# Patient Record
Sex: Male | Born: 1965 | Race: White | Hispanic: No | Smoking: Current every day smoker
Health system: Southern US, Community
[De-identification: ages and names within clinical notes are randomized; demographics above are authoritative.]

## PROBLEM LIST (undated history)

## (undated) DIAGNOSIS — M1990 Unspecified osteoarthritis, unspecified site: Secondary | ICD-10-CM

---

## 2001-04-18 ENCOUNTER — Emergency Department (HOSPITAL_COMMUNITY): Admission: EM | Admit: 2001-04-18 | Discharge: 2001-04-18 | Payer: Self-pay | Admitting: Emergency Medicine

## 2001-04-18 ENCOUNTER — Encounter: Payer: Self-pay | Admitting: Emergency Medicine

## 2002-04-01 ENCOUNTER — Encounter: Payer: Self-pay | Admitting: Emergency Medicine

## 2002-04-01 ENCOUNTER — Emergency Department (HOSPITAL_COMMUNITY): Admission: EM | Admit: 2002-04-01 | Discharge: 2002-04-01 | Payer: Self-pay | Admitting: Emergency Medicine

## 2005-03-07 ENCOUNTER — Emergency Department (HOSPITAL_COMMUNITY): Admission: EM | Admit: 2005-03-07 | Discharge: 2005-03-07 | Payer: Self-pay | Admitting: Emergency Medicine

## 2005-07-23 ENCOUNTER — Emergency Department (HOSPITAL_COMMUNITY): Admission: EM | Admit: 2005-07-23 | Discharge: 2005-07-23 | Payer: Self-pay | Admitting: Emergency Medicine

## 2009-03-03 ENCOUNTER — Emergency Department (HOSPITAL_COMMUNITY): Admission: EM | Admit: 2009-03-03 | Discharge: 2009-03-03 | Payer: Self-pay | Admitting: Emergency Medicine

## 2009-04-07 ENCOUNTER — Emergency Department (HOSPITAL_COMMUNITY): Admission: EM | Admit: 2009-04-07 | Discharge: 2009-04-07 | Payer: Self-pay | Admitting: Emergency Medicine

## 2009-09-23 ENCOUNTER — Emergency Department (HOSPITAL_COMMUNITY): Admission: EM | Admit: 2009-09-23 | Discharge: 2009-09-23 | Payer: Self-pay | Admitting: Emergency Medicine

## 2009-11-22 ENCOUNTER — Emergency Department (HOSPITAL_COMMUNITY): Admission: EM | Admit: 2009-11-22 | Discharge: 2009-11-22 | Payer: Self-pay | Admitting: Emergency Medicine

## 2010-10-03 LAB — BASIC METABOLIC PANEL
BUN: 12 mg/dL (ref 6–23)
CO2: 24 mEq/L (ref 19–32)
GFR calc Af Amer: >60 mL/min (ref >60)
Sodium: 138 mEq/L (ref 135–145)

## 2010-10-03 LAB — DIFFERENTIAL
Lymphocytes Relative: 26 % (ref 12–46)
Monocytes Absolute: 0.6 10*3/uL (ref 0.1–1.0)
Monocytes Relative: 7 % (ref 3–12)
Neutro Abs: 5.3 10*3/uL (ref 1.7–7.7)

## 2010-10-03 LAB — SEDIMENTATION RATE: Sed Rate: 8 mm/hr (ref 0–16)

## 2010-10-03 LAB — URIC ACID: Uric Acid, Serum: 6.1 mg/dL (ref 4.0–7.8)

## 2010-10-03 LAB — CBC: MCV: 95.8 fL (ref 78.0–100.0)

## 2010-11-25 NOTE — Consult Note (Signed)
NAMENEWMAN, WAREN NO.:  [id number]   MEDICAL RECORD NO.:  [id number]          PATIENT TYPE:  EMS   LOCATION:  [facility]                         FACILITY:  [facility]   PHYSICIAN:  Artist Pais. Mina Marble, M.D.DATE OF BIRTH:  02/20/1966   DATE OF CONSULTATION:  07/23/2005  DATE OF DISCHARGE:  07/23/2005                                   CONSULTATION   REFERRING PHYSICIAN:  Nelva Nay, MD   REASON FOR CONSULTATION:  Mr. Kovacevic is a 45 year old ambidextrous male who  fell while playing football and presents today with pain and deformity of  the right upper extremity.  He is 45 years old.   ALLERGIES:  HE HAS NO KNOWN DRUG ALLERGIES.   MEDICATIONS:  No current medications.   PAST MEDICAL HISTORY:  No recent hospitalizations or surgery.   FAMILY HISTORY:  Noncontributory.   SOCIAL HISTORY:  He occasionally drinks and smokes.  He works as a Music therapist  for [employer].   PHYSICAL EXAMINATION:  GENERAL:  A well-developed, well-nourished male,  pleasant, alert and oriented x 3.  EXTREMITIES:  On examination of his right  upper extremity, he has pain and swelling dorsally in the area of the distal  radius.  He is neurovascularly intact.  He denies numbness and tingling in  the median radial and ulnar distribution.  Hand function appears to be  intact.   LABORATORY DATA:  X-rays show a comminuted distal radius fracture with some  shortening and dorsal angulation as well as a nondisplaced ulnar styloid  fracture.   ASSESSMENT/PLAN:  A 45 year old male with a distal radius and ulnar fracture  on his right side.  He was given a 2% plain lidocaine hematoma block and was  placed in fingertrap traction.  Reduction was performed.  Postreduction  films showed adequate reduction in both the AP and lateral planes.  He was  taken from the emergency room and  sent home with Percocet for pain.  Follow up in my office on July 27, 2005.  He was instructed on the signs and  symptoms of compartment syndrome  and told to return to the emergency room immediately or call my office if  any signs or symptoms appear in the right upper extremity.      Artist Pais Mina Marble, M.D.  Electronically Signed     MAW/MEDQ  D:  07/23/2005  T:  07/24/2005  Job:  [id number]   cc:   Nelva Nay, MD  Fax: [phone]

## 2012-11-22 ENCOUNTER — Emergency Department (HOSPITAL_COMMUNITY)
Admission: EM | Admit: 2012-11-22 | Discharge: 2012-11-22 | Disposition: A | Discharge status: Home / Self Care | Payer: Self-pay | Attending: Emergency Medicine | Admitting: Emergency Medicine

## 2012-11-22 ENCOUNTER — Encounter (HOSPITAL_COMMUNITY): Payer: Self-pay | Admitting: Emergency Medicine

## 2012-11-22 DIAGNOSIS — Y9289 Other specified places as the place of occurrence of the external cause: Secondary | ICD-10-CM | POA: Insufficient documentation

## 2012-11-22 DIAGNOSIS — Z8739 Personal history of other diseases of the musculoskeletal system and connective tissue: Secondary | ICD-10-CM | POA: Insufficient documentation

## 2012-11-22 DIAGNOSIS — Y990 Civilian activity done for income or pay: Secondary | ICD-10-CM | POA: Insufficient documentation

## 2012-11-22 DIAGNOSIS — IMO0002 Reserved for concepts with insufficient information to code with codable children: Secondary | ICD-10-CM | POA: Insufficient documentation

## 2012-11-22 DIAGNOSIS — F17200 Nicotine dependence, unspecified, uncomplicated: Secondary | ICD-10-CM | POA: Insufficient documentation

## 2012-11-22 DIAGNOSIS — Y9389 Activity, other specified: Secondary | ICD-10-CM | POA: Insufficient documentation

## 2012-11-22 DIAGNOSIS — T148XXA Other injury of unspecified body region, initial encounter: Secondary | ICD-10-CM

## 2012-11-22 DIAGNOSIS — X500XXA Overexertion from strenuous movement or load, initial encounter: Secondary | ICD-10-CM | POA: Insufficient documentation

## 2012-11-22 HISTORY — DX: Unspecified osteoarthritis, unspecified site: M19.90

## 2012-11-22 LAB — URINALYSIS, ROUTINE W REFLEX MICROSCOPIC
Bilirubin Urine: NEGATIVE
Glucose, UA: NEGATIVE mg/dL
Hgb urine dipstick: NEGATIVE
Leukocytes, UA: NEGATIVE
Nitrite: NEGATIVE
Protein, ur: NEGATIVE mg/dL
Specific Gravity, Urine: 1.031 — ABNORMAL HIGH (ref 1.005–1.030)
Urobilinogen, UA: 1 mg/dL (ref 0.0–1.0)
pH: 5.5 (ref 5.0–8.0)

## 2012-11-22 MED ORDER — MELOXICAM 7.5 MG PO TABS: 15.0000 mg | ORAL_TABLET | Freq: Every day | ORAL | Status: AC

## 2012-11-22 NOTE — ED Provider Notes (Signed)
History     CSN: [id number]  Arrival date & time 11/22/12  4098   First MD Initiated Contact with Patient 11/22/12 0602      Chief Complaint  Patient presents with  . Groin Pain    (Consider location/radiation/quality/duration/timing/severity/associated sxs/prior treatment) HPI Comments: Patient presents emergency department with chief complaint of right sided groin pain. Patient states that he's been having this pain for the past 1-2 weeks. 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 This is when his symptoms started. He denies fevers, chills, nausea, or vomiting. He denies any dysuria, hematuria or penile discharge. Denies any changes in bowel movements, no constipation or diarrhea. He denies pain in his testicles. He states that the pain is intermittent, but is worsened with hip flexion. The pain radiates to his right leg.  The history is provided by the patient. No language interpreter was used.    Past Medical History  Diagnosis Date  . Arthritis     History reviewed. No pertinent past surgical history.  History reviewed. No pertinent family history.  History  Substance Use Topics  . Smoking status: Current Every Day Smoker  . Smokeless tobacco: Not on file  . Alcohol Use: No      Review of Systems  All other systems reviewed and are negative.    Allergies  Review of patient's allergies indicates no known allergies.  Home Medications  No current outpatient prescriptions on file.  BP 137/80  Pulse 72  Temp(Src) 97.9 F (36.6 C) (Oral)  Resp 15  Ht 5\' 8"  (1.727 m)  Wt 180 lb (81.647 kg)  BMI 27.38 kg/m2  SpO2 96%  Physical Exam  Nursing note and vitals reviewed. Constitutional: He is oriented to person, place, and time. He appears well-developed and well-nourished.  HENT:  Head: Normocephalic and atraumatic.  Nose: Nose  normal.  Eyes: Conjunctivae and EOM are normal. Pupils are equal, round, and reactive to light. Right eye exhibits no discharge. Left eye exhibits no discharge. No scleral icterus.  Neck: Normal range of motion. Neck supple. No JVD present.  Cardiovascular: Normal rate, regular rhythm, normal heart sounds and intact distal pulses.  Exam reveals no gallop and no friction rub.   No murmur heard. Pulmonary/Chest: Effort normal and breath sounds normal. No respiratory distress. He has no wheezes. He has no rales. He exhibits no tenderness.  Abdominal: Soft. Bowel sounds are normal. He exhibits no distension and no mass. There is no tenderness. There is no rebound and no guarding.  No focal abdominal tenderness  Genitourinary: Penis normal.  Circumcised male, no penile discharge, no masses, or lesions on the penis, and no masses of the testicles, no palpable hernias in the scrotum or inguinal region  Musculoskeletal: Normal range of motion. He exhibits tenderness. He exhibits no edema.  Right sided quadriceps mild to moderately tender to palpation, mildly painful with hip flexion, range of motion 5/5, strength 5/5 in bilateral lower tremors  Neurological: He is alert and oriented to person, place, and time.  Skin: Skin is warm and dry.  Psychiatric: He has a normal mood and affect. His behavior is normal. Judgment and thought content normal.    ED Course  Procedures (including critical care time)  Labs Reviewed  URINALYSIS, ROUTINE W REFLEX MICROSCOPIC   Results for orders placed during the hospital encounter of 11/22/12  URINALYSIS, ROUTINE W  REFLEX MICROSCOPIC      Result Value Range   Color, Urine YELLOW  YELLOW   APPearance CLEAR  CLEAR   Specific Gravity, Urine 1.031 (*) 1.005 - 1.030   pH 5.5  5.0 - 8.0   Glucose, UA NEGATIVE  NEGATIVE mg/dL   Hgb urine dipstick NEGATIVE  NEGATIVE   Bilirubin Urine NEGATIVE  NEGATIVE   Ketones, ur NEGATIVE  NEGATIVE mg/dL   Protein, ur NEGATIVE   NEGATIVE mg/dL   Urobilinogen, UA 1.0  0.0 - 1.0 mg/dL   Nitrite NEGATIVE  NEGATIVE   Leukocytes, UA NEGATIVE  NEGATIVE      1. Muscle strain       MDM  Patient with right groin pain.  No testicle pain.  No masses or herniations.  No dysuria, hematuria, or discharge.  No fevers, chills, or n/v.  Suspect muscle strain, as the patient's symptoms are reproducible with hip flexion and pain on palpation of quadriceps.  Discussed with Dr. Rulon Abide, who agrees with the plan.  Ice and NSAIDs.  Patient requests Mobic.  Patient is stable and ready for discharge.        Roxy Horseman, PA-C 11/22/12 [phone]

## 2012-11-22 NOTE — ED Provider Notes (Signed)
Medical screening examination/treatment/procedure(s) were performed by non-physician practitioner and as supervising physician I was immediately available for consultation/collaboration.  Jones Skene, M.D.     Jones Skene, MD 11/22/12 [phone]

## 2012-11-22 NOTE — ED Notes (Addendum)
Pt states his job requires him to do a lot of lifting. One day lifting he felt a pulled area R side of groin. Pt states no changes in urine color or output. Pt states no redness or swelling in that area.

## 2012-12-03 ENCOUNTER — Emergency Department (HOSPITAL_COMMUNITY): Payer: Self-pay

## 2012-12-03 ENCOUNTER — Encounter (HOSPITAL_COMMUNITY): Payer: Self-pay | Admitting: Cardiology

## 2012-12-03 ENCOUNTER — Emergency Department (HOSPITAL_COMMUNITY)
Admission: EM | Admit: 2012-12-03 | Discharge: 2012-12-03 | Disposition: A | Discharge status: Home / Self Care | Payer: Self-pay | Attending: Emergency Medicine | Admitting: Emergency Medicine

## 2012-12-03 DIAGNOSIS — F17200 Nicotine dependence, unspecified, uncomplicated: Secondary | ICD-10-CM | POA: Insufficient documentation

## 2012-12-03 DIAGNOSIS — S76911D Strain of unspecified muscles, fascia and tendons at thigh level, right thigh, subsequent encounter: Secondary | ICD-10-CM

## 2012-12-03 DIAGNOSIS — Y9389 Activity, other specified: Secondary | ICD-10-CM | POA: Insufficient documentation

## 2012-12-03 DIAGNOSIS — Y929 Unspecified place or not applicable: Secondary | ICD-10-CM | POA: Insufficient documentation

## 2012-12-03 DIAGNOSIS — Z79899 Other long term (current) drug therapy: Secondary | ICD-10-CM | POA: Insufficient documentation

## 2012-12-03 DIAGNOSIS — M129 Arthropathy, unspecified: Secondary | ICD-10-CM | POA: Insufficient documentation

## 2012-12-03 DIAGNOSIS — X58XXXA Exposure to other specified factors, initial encounter: Secondary | ICD-10-CM | POA: Insufficient documentation

## 2012-12-03 DIAGNOSIS — IMO0002 Reserved for concepts with insufficient information to code with codable children: Secondary | ICD-10-CM | POA: Insufficient documentation

## 2012-12-03 MED ORDER — METHOCARBAMOL 500 MG PO TABS
1000.0000 mg | ORAL_TABLET | Freq: Once | ORAL | Status: AC
  Administered 2012-12-03: 1000 mg via ORAL
  Filled 2012-12-03: qty 2

## 2012-12-03 MED ORDER — METHOCARBAMOL 750 MG PO TABS: 750.0000 mg | ORAL_TABLET | Freq: Four times a day (QID) | ORAL | Status: AC | PRN

## 2012-12-03 NOTE — ED Provider Notes (Signed)
History     CSN: [id number]  Arrival date & time 12/03/12  8295   First MD Initiated Contact with Patient 12/03/12 [phone]      Chief Complaint  Patient presents with  . Groin Pain    (Consider location/radiation/quality/duration/timing/severity/associated sxs/prior treatment) HPI Comments: Pt was seen in ED 5/16 for right groin pain, diagnosed with muscle strain.  Pain is in right inner thigh with radiation into right anterior thigh, described as sore, worse with ambulation, better with rest.  Has been taking mobic without relief.  States he took the weekend off of work after this happened and seemed to be improving, but the first day he started back to work the pain increased again and he was having pain with walking.  Pain occasionally in right testicle and occasional numbness in right calf that occurs only when it is elevated and resolves spontaneously with change of position.  Denies fevers, chills, myalgias, abdominal pain, scrotal/testicular swelling, penile discharge, change in bowel habits, N/V/D, constipation, hematochezia or melena.    Patient is a 47 y.o. male presenting with groin pain. The history is provided by the patient and medical records.  Groin Pain Pertinent negatives include no abdominal pain, chest pain, chills, coughing, fever, nausea, numbness, rash, vomiting or weakness.    Past Medical History  Diagnosis Date  . Arthritis     History reviewed. No pertinent past surgical history.  History reviewed. No pertinent family history.  History  Substance Use Topics  . Smoking status: Current Every Day Smoker    Types: Cigarettes  . Smokeless tobacco: Not on file  . Alcohol Use: No      Review of Systems  Constitutional: Negative for fever and chills.  Respiratory: Negative for cough and shortness of breath.   Cardiovascular: Negative for chest pain.  Gastrointestinal: Negative for nausea, vomiting, abdominal pain and diarrhea.  Genitourinary: Negative for  dysuria, urgency, frequency, penile swelling, scrotal swelling and penile pain.  Skin: Negative for rash.  Neurological: Negative for weakness and numbness.    Allergies  Review of patient's allergies indicates no known allergies.  Home Medications   Current Outpatient Rx  Name  Route  Sig  Dispense  Refill  . meloxicam (MOBIC) 7.5 MG tablet   Oral   Take 2 tablets (15 mg total) by mouth daily.   30 tablet   0     BP 119/76  Pulse 64  Temp(Src) 97.3 F (36.3 C) (Oral)  Resp 18  SpO2 98%  Physical Exam  Nursing note and vitals reviewed. Constitutional: He appears well-developed and well-nourished. No distress.  HENT:  Head: Normocephalic and atraumatic.  Neck: Neck supple.  Pulmonary/Chest: Effort normal.  Abdominal: Soft. He exhibits no distension and no mass. There is no tenderness. There is no rebound and no guarding. Hernia confirmed negative in the right inguinal area.  Genitourinary: Testes normal and penis normal.    Right testis shows no mass, no swelling and no tenderness. Left testis shows no mass, no swelling and no tenderness. Circumcised.  Musculoskeletal:       Right hip: Normal.  Right lower extremity:  Strength 5/5, sensation intact, distal pulses intact.    Right thigh: mild tenderness to proximal, medial aspect.  No skin changes.  No erythema, edema, warmth.  Right hip with full active and passive ROM.  Pain is flexion of hip and abduction.  No bony tenderness.     Lymphadenopathy:       Right: No inguinal adenopathy present.  Neurological: He is alert.  Skin: He is not diaphoretic.    ED Course  Procedures (including critical care time)  Labs Reviewed - No data to display Dg Hip Complete Right  12/03/2012   *RADIOLOGY REPORT*  Clinical Data: Injury 3 weeks previous with persistent pain  RIGHT HIP - COMPLETE 2+ VIEW  Comparison: None.  Findings: No acute fracture or dislocation is noted.  No soft tissue abnormality is seen.  The pelvic ring is  intact.  IMPRESSION: No acute abnormality noted.   Original Report Authenticated By: Alcide Clever, M.D.     1. Muscle strain of thigh, right, subsequent encounter     MDM  Pt with right thigh pain, seen in ED on 5/16, diagnosed with muscle strain.  No hernias on exam.  No abdominal pain or tenderness.  Given worsening/persistent pain, xray ordered of hip to r/o hip and pelvic bony abnormalities causing symptoms.  Xray is negative.  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  Discussed all results with patient.  Pt given return precautions.  Pt verbalizes understanding and agrees with plan.     I doubt any other EMC precluding discharge at this time including, but not necessarily limited to the following:  Incarcerated/strangulted hernia, septic hip, epididymitis, testicular torsion     Trixie Dredge, PA-C 12/03/12 1052

## 2012-12-03 NOTE — ED Provider Notes (Signed)
Medical screening examination/treatment/procedure(s) were performed by non-physician practitioner and as supervising physician I was immediately available for consultation/collaboration.  Harshita Bernales, MD 12/03/12 1604 

## 2012-12-03 NOTE — ED Notes (Signed)
Pt returned from x-ray. Up to the bathroom. Provider now at the bedside.

## 2012-12-03 NOTE — ED Notes (Signed)
Provider at the bedside.  

## 2012-12-03 NOTE — ED Notes (Signed)
Pt reports that he went to [facility] about a week ago and given medication for pain in his groin area and swelling. Reports that he feels worse and the pain is not getting better. Denies any urinary problems. Pt points to inner thigh for the area of pain.

## 2012-12-14 ENCOUNTER — Emergency Department (HOSPITAL_COMMUNITY)
Admission: EM | Admit: 2012-12-14 | Discharge: 2012-12-14 | Disposition: A | Discharge status: Home / Self Care | Payer: Self-pay | Attending: Emergency Medicine | Admitting: Emergency Medicine

## 2012-12-14 ENCOUNTER — Encounter (HOSPITAL_COMMUNITY): Payer: Self-pay | Admitting: Emergency Medicine

## 2012-12-14 DIAGNOSIS — IMO0001 Reserved for inherently not codable concepts without codable children: Secondary | ICD-10-CM | POA: Insufficient documentation

## 2012-12-14 DIAGNOSIS — M79651 Pain in right thigh: Secondary | ICD-10-CM

## 2012-12-14 DIAGNOSIS — F17200 Nicotine dependence, unspecified, uncomplicated: Secondary | ICD-10-CM | POA: Insufficient documentation

## 2012-12-14 DIAGNOSIS — M25559 Pain in unspecified hip: Secondary | ICD-10-CM | POA: Insufficient documentation

## 2012-12-14 DIAGNOSIS — Z79899 Other long term (current) drug therapy: Secondary | ICD-10-CM | POA: Insufficient documentation

## 2012-12-14 DIAGNOSIS — Z8739 Personal history of other diseases of the musculoskeletal system and connective tissue: Secondary | ICD-10-CM | POA: Insufficient documentation

## 2012-12-14 MED ORDER — HYDROCODONE-ACETAMINOPHEN 5-325 MG PO TABS
1.0000 | ORAL_TABLET | Freq: Once | ORAL | Status: AC
  Administered 2012-12-14: 1 via ORAL
  Filled 2012-12-14: qty 1

## 2012-12-14 MED ORDER — HYDROCODONE-ACETAMINOPHEN 5-325 MG PO TABS: 2.0000 | ORAL_TABLET | ORAL | Status: DC | PRN

## 2012-12-14 NOTE — ED Notes (Signed)
Pt presents to ED with complaints of right leg pain for about a month. Pt states this is his third visit for same without relief.

## 2012-12-14 NOTE — ED Provider Notes (Signed)
History/physical exam/procedure(s) were performed by non-physician practitioner and as supervising physician I was immediately available for consultation/collaboration. I have reviewed all notes and am in agreement with care and plan.   Hilario Quarry, MD 12/14/12 [phone]

## 2012-12-14 NOTE — ED Notes (Signed)
Pt. Demonstrated proper use of using crutches.

## 2012-12-14 NOTE — ED Provider Notes (Signed)
History     CSN: [id number]  Arrival date & time 12/14/12  4098   First MD Initiated Contact with Patient 12/14/12 0848      Chief Complaint  Patient presents with  . Leg Pain    (Consider location/radiation/quality/duration/timing/severity/associated sxs/prior treatment) HPI  47 year old male with history of arthritis presents complaining of right leg pain ongoing for one month. 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  For the past month he has experienced worsening pain to his R thigh which radiates down to R leg.  Pain is achy, sharp sensation worsening with weight bearing and improves with rest.  Pt first noticed the pain when he was lifting some heavy object at work, but pain was initially mild.  Pt was seen in ER several weeks ago, diagnosed with muscle strain and have been performing RICE therapy.  Pain not iproved, was seen in ER 2 weeks ago for same complaint.  Xrays were performed showing no fx or dislocation.  He was given muscle relaxant and NSAIDs with helps minimally.  He has continue to work but is having difficulty bearing weight and having increasing pain at work.  He's here again for pain control.  Denies fever, rash, cp, sob, hemoptysis, testicular pain or scrotal swelling, no hernia, no dysuria, no numbness or weakness.  Pain primarily to R medial thigh radiates to R buttock and down to anterior thigh.  Denies leg swelling.  Is a smoker but no other significant risk factors for PE or DVT.    Past Medical History  Diagnosis Date  . Arthritis     History reviewed. No pertinent past surgical history.  History reviewed. No pertinent family history.  History  Substance Use Topics  . Smoking status: Current Every Day Smoker    Types: Cigarettes  . Smokeless tobacco: Not on file  . Alcohol Use: No      Review of Systems  Constitutional: Negative for fever.  Gastrointestinal: Negative for abdominal pain.  Genitourinary:  Negative for hematuria, flank pain, scrotal swelling and testicular pain.  Musculoskeletal: Positive for myalgias, back pain, arthralgias and gait problem.  Skin: Negative for rash and wound.  Neurological: Negative for numbness.    Allergies  Review of patient's allergies indicates no known allergies.  Home Medications   Current Outpatient Rx  Name  Route  Sig  Dispense  Refill  . ibuprofen (ADVIL,MOTRIN) 200 MG tablet   Oral   Take 400 mg by mouth every 6 (six) hours as needed for pain.         . meloxicam (MOBIC) 7.5 MG tablet   Oral   Take 2 tablets (15 mg total) by mouth daily.   30 tablet   0   . methocarbamol (ROBAXIN) 750 MG tablet   Oral   Take 1 tablet (750 mg total) by mouth 4 (four) times daily as needed.   20 tablet   0   . Multiple Vitamin (MULTIVITAMIN WITH MINERALS) TABS   Oral   Take 1 tablet by mouth daily.           BP 138/83  Pulse 64  Temp(Src) 97.6 F (36.4 C) (Oral)  Resp 14  Ht 5\' 8"  (1.727 m)  Wt 175 lb (79.379 kg)  BMI 26.61 kg/m2  SpO2 99%  Physical Exam  Nursing note and vitals reviewed. Constitutional: He appears well-developed and well-nourished. No distress.  HENT:  Head: Atraumatic.  Eyes: Conjunctivae are normal.  Neck: Neck supple.  Cardiovascular: Intact distal pulses.   Musculoskeletal: He exhibits tenderness (R leg: tenderness along medial, anterior, and lateral aspect of R thigh on palpation.  increasing pain with R hip abduction and adduction however with FROM to R hip.  BLE without palpable cords, erythema, edema, negative homan sign.  ). He exhibits no edema.  Neurological: He is alert.  Skin: Skin is warm. No rash noted.    ED Course  Procedures (including critical care time)  9:30 AM Patient with progressive right thigh pain. Pain appears to be musculoskeletal as it is worsened with movement and improves with rest. No evidence of hernia noted. No evidence of cellulitis or abscess noted. No signs and symptoms  suggestive of DVT. He is neurovascularly intact. The symptom has been ongoing for a month. He will benefit from rice therapy, and followup with orthopedic for further management. I will give patient a work note due to his demanding physical labor. Will also prescribe pain medication as over-the-counter medication is no longer effective. He is afebrile with stable normal vital sign.  Doubt claudication.  Labs Reviewed - No data to display No results found.   1. Right thigh pain       MDM  BP 138/83  Pulse 64  Temp(Src) 97.6 F (36.4 C) (Oral)  Resp 14  Ht 5\' 8"  (1.727 m)  Wt 175 lb (79.379 kg)  BMI 26.61 kg/m2  SpO2 99%  I have reviewed nursing notes and vital signs.   I reviewed available ER/hospitalization records thought the EMR         Fayrene Helper, [place] 12/14/12 1610

## 2012-12-30 ENCOUNTER — Encounter (HOSPITAL_COMMUNITY): Payer: Self-pay | Admitting: Emergency Medicine

## 2012-12-30 ENCOUNTER — Emergency Department (HOSPITAL_COMMUNITY)
Admission: EM | Admit: 2012-12-30 | Discharge: 2012-12-30 | Disposition: A | Discharge status: Home / Self Care | Payer: Self-pay | Attending: Emergency Medicine | Admitting: Emergency Medicine

## 2012-12-30 ENCOUNTER — Emergency Department (HOSPITAL_COMMUNITY): Payer: Self-pay

## 2012-12-30 DIAGNOSIS — M79609 Pain in unspecified limb: Secondary | ICD-10-CM | POA: Insufficient documentation

## 2012-12-30 DIAGNOSIS — M129 Arthropathy, unspecified: Secondary | ICD-10-CM | POA: Insufficient documentation

## 2012-12-30 DIAGNOSIS — Z791 Long term (current) use of non-steroidal anti-inflammatories (NSAID): Secondary | ICD-10-CM | POA: Insufficient documentation

## 2012-12-30 DIAGNOSIS — M79604 Pain in right leg: Secondary | ICD-10-CM

## 2012-12-30 DIAGNOSIS — F17200 Nicotine dependence, unspecified, uncomplicated: Secondary | ICD-10-CM | POA: Insufficient documentation

## 2012-12-30 DIAGNOSIS — Z79899 Other long term (current) drug therapy: Secondary | ICD-10-CM | POA: Insufficient documentation

## 2012-12-30 MED ORDER — HYDROMORPHONE HCL PF 1 MG/ML IJ SOLN: 1.0000 mg | Freq: Once | INTRAMUSCULAR | Status: DC

## 2012-12-30 MED ORDER — OXYCODONE-ACETAMINOPHEN 5-325 MG PO TABS: 1.0000 | ORAL_TABLET | ORAL | Status: DC | PRN

## 2012-12-30 MED ORDER — KETOROLAC TROMETHAMINE 60 MG/2ML IM SOLN
60.0000 mg | Freq: Once | INTRAMUSCULAR | Status: AC
  Administered 2012-12-30: 60 mg via INTRAMUSCULAR
  Filled 2012-12-30: qty 2

## 2012-12-30 NOTE — ED Notes (Signed)
Rt leg pain for a while 6 weeks  has been seen and tx given cuitches seems to be getting worse. No new injury has run out of pain meds pain extends from calf to knee and down  Leg hurt origianlly by lifting something heavy has no insurance so cannot see ortho dr

## 2012-12-30 NOTE — ED Notes (Signed)
Pt states that he is driving himself dr Hyacinth Meeker made aware and he states to just give pt toredol

## 2012-12-30 NOTE — ED Provider Notes (Signed)
History     CSN: [id number]  Arrival date & time 12/30/12  0744   First MD Initiated Contact with Patient 12/30/12 [phone]      Chief Complaint  Patient presents with  . Leg Pain    (Consider location/radiation/quality/duration/timing/severity/associated sxs/prior treatment) HPI Comments: 47 year old male who has a history of a small amount of arthritis, smokes cigarettes but has no other significant medical problems. 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 He felt pain in his right groin radiating into his right thigh which has been persistent since that time. When he is laying down and nonweightbearing the pain is minimal however when he walks or tries to use the right leg he has severe pain. He feels like the pain is sharp, radiates to his right knee and feels like his right knee gives out. He has been ambulating with crutches, denies swelling of the leg. He has been evaluated with a hip x-ray in the emergency department one month ago, no other evaluations, no specialty follow up. He has been using Aleve with minimal improvement, states hydrocodone does not help.  Patient is a 47 y.o. male presenting with leg pain. The history is provided by the patient and medical records.  Leg Pain   Past Medical History  Diagnosis Date  . Arthritis     No past surgical history on file.  No family history on file.  History  Substance Use Topics  . Smoking status: Current Every Day Smoker    Types: Cigarettes  . Smokeless tobacco: Not on file  . Alcohol Use: No      Review of Systems  All other systems reviewed and are negative.    Allergies  Review of patient's allergies indicates no known allergies.  Home Medications   Current Outpatient Rx  Name  Route  Sig  Dispense  Refill  . HYDROcodone-acetaminophen (NORCO/VICODIN) 5-325 MG per tablet   Oral   Take 2 tablets by mouth every 4  (four) hours as needed for pain.   10 tablet   0   . ibuprofen (ADVIL,MOTRIN) 200 MG tablet   Oral   Take 400 mg by mouth every 6 (six) hours as needed for pain.         . meloxicam (MOBIC) 7.5 MG tablet   Oral   Take 2 tablets (15 mg total) by mouth daily.   30 tablet   0   . methocarbamol (ROBAXIN) 750 MG tablet   Oral   Take 1 tablet (750 mg total) by mouth 4 (four) times daily as needed.   20 tablet   0   . Multiple Vitamin (MULTIVITAMIN WITH MINERALS) TABS   Oral   Take 1 tablet by mouth daily.         . Naproxen Sodium (ALEVE PO)   Oral   Take 440 mg by mouth 2 (two) times daily.         Marland Kitchen oxyCODONE-acetaminophen (PERCOCET) 5-325 MG per tablet   Oral   Take 1 tablet by mouth every 4 (four) hours as needed for pain.   20 tablet   0     BP 129/89  Pulse 76  Temp(Src) 98.2 F (36.8 C)  Resp 16  SpO2 99%  Physical Exam  Nursing note and vitals reviewed. Constitutional: He appears well-developed and well-nourished. No distress.  HENT:  Head: Normocephalic and atraumatic.  Mouth/Throat: Oropharynx  is clear and moist. No oropharyngeal exudate.  Eyes: Conjunctivae and EOM are normal. Pupils are equal, round, and reactive to light. Right eye exhibits no discharge. Left eye exhibits no discharge. No scleral icterus.  Neck: Normal range of motion. Neck supple. No JVD present. No thyromegaly present.  Cardiovascular: Normal rate, regular rhythm, normal heart sounds and intact distal pulses.  Exam reveals no gallop and no friction rub.   No murmur heard. Pulmonary/Chest: Effort normal and breath sounds normal. No respiratory distress. He has no wheezes. He has no rales.  Abdominal: Soft. Bowel sounds are normal. He exhibits no distension and no mass. There is no tenderness.  Musculoskeletal: Normal range of motion. He exhibits tenderness. He exhibits no edema.  Significant tenderness with ambulation, right knee gives out with ambulation, no obvious deformity of  the lower extremity, able to extend at the knee when the hip is passively flexed, no quadricep defect or patellar tendon defect, no tenderness with palpation over the quadriceps or the knee. There is no instability around the right knee joint.  Lymphadenopathy:    He has no cervical adenopathy.  Neurological: He is alert. Coordination normal.  Normal sensation to light touch and pinprick of the right lower extremity, normal strength at the knee and the ankle, given out weakness at the right hip secondary to pain with hip flexors.  Skin: Skin is warm and dry. No rash noted. No erythema.  Psychiatric: He has a normal mood and affect. His behavior is normal.    ED Course  Procedures (including critical care time)  Labs Reviewed - No data to display Dg Femur Right  12/30/2012   *RADIOLOGY REPORT*  Clinical Data: groin injury 6 weeks ago.  Continued pain.  RIGHT FEMUR - 2 VIEW  Comparison: 12/03/2012  Findings: Mild patellar spurring.   No fracture or acute bony findings.  IMPRESSION:  1.  No acute bony findings. 2.  Patellar spurring.   Original Report Authenticated By: Gaylyn Rong, M.D.   Dg Knee Complete 4 Views Right  12/30/2012   *RADIOLOGY REPORT*  Clinical Data: Injury 6 weeks ago, pain  RIGHT KNEE - COMPLETE 4+ VIEW  Comparison: 12/30/2012  Findings: Normal alignment without fracture or effusion.  Preserved joint spaces.  No significant arthropathy or degenerative process. No soft tissue abnormality.  IMPRESSION: No acute finding   Original Report Authenticated By: Judie Petit. Shick, M.D.     1. Lower extremity pain, anterior, right       MDM  No obvious etiology to the patient's pain is evident on exam however he does have significant pain with straight leg raising when the knee is not raised, this leads me to think that the patient has hip flexor pain and inflammation. There is not appear to be any bony deformities however will image to evaluate for underlying bony tumor. Pain  medications ordered, orthopedic subspecialty followup will be necessary.  This does not appear to be a neurological abnormality, it started with what was apparently a Musculoskeletal injury and has persisted essentially unchanged over the last several weeks.  He will need ongoing PT, orthopedic f/u and nsaids / pain control to appropriately rehabilitate.  Imaging neg for frx / mass.  Meds given in ED:  Medications  ketorolac (TORADOL) injection 60 mg (60 mg Intramuscular Given 12/30/12 0832)    Discharge Medication List as of 12/30/2012 11:19 AM    START taking these medications   Details  oxyCODONE-acetaminophen (PERCOCET) 5-325 MG per tablet Take 1 tablet by  mouth every 4 (four) hours as needed for pain., Starting 12/30/2012, Until Discontinued, Print                Vida Roller, MD 12/30/12 2043

## 2014-12-14 ENCOUNTER — Emergency Department (HOSPITAL_COMMUNITY)
Admission: EM | Admit: 2014-12-14 | Discharge: 2014-12-14 | Disposition: A | Discharge status: Home / Self Care | Payer: Self-pay | Attending: Emergency Medicine | Admitting: Emergency Medicine

## 2014-12-14 ENCOUNTER — Encounter (HOSPITAL_COMMUNITY): Payer: Self-pay | Admitting: Emergency Medicine

## 2014-12-14 DIAGNOSIS — M1990 Unspecified osteoarthritis, unspecified site: Secondary | ICD-10-CM | POA: Insufficient documentation

## 2014-12-14 DIAGNOSIS — Z791 Long term (current) use of non-steroidal anti-inflammatories (NSAID): Secondary | ICD-10-CM | POA: Insufficient documentation

## 2014-12-14 DIAGNOSIS — Z79899 Other long term (current) drug therapy: Secondary | ICD-10-CM | POA: Insufficient documentation

## 2014-12-14 DIAGNOSIS — H6011 Cellulitis of right external ear: Secondary | ICD-10-CM | POA: Insufficient documentation

## 2014-12-14 DIAGNOSIS — Z720 Tobacco use: Secondary | ICD-10-CM | POA: Insufficient documentation

## 2014-12-14 LAB — CBG MONITORING, ED: GLUCOSE-CAPILLARY: 119 mg/dL — AB (ref 65–99)

## 2014-12-14 MED ORDER — CEPHALEXIN 500 MG PO CAPS: 500.0000 mg | ORAL_CAPSULE | Freq: Four times a day (QID) | ORAL | Status: DC

## 2014-12-14 NOTE — ED Provider Notes (Signed)
9:07 AM Patient seen with Elease Hashimotoapia PA-C. Patient chronically picks at his skin. He has opened up a small area on the R pinna/helix. No significant cellulitis however patient has developed a reactive LN inferior to ear. He appears well, non-toxic.   Plan: d/c home with keflex, discussed wound care  Pt urged to return with worsening pain, worsening swelling, expanding area of redness or streaking up extremity, fever, or any other concerns. Urged to take complete course of antibiotics as prescribed. Pt verbalizes understanding and agrees with plan.   Renne CriglerJoshua Maynard David, PA-C 12/14/14 [id number]  Pricilla LovelessScott Goldston, MD 12/16/14 1011

## 2014-12-14 NOTE — ED Provider Notes (Signed)
CSN: [id number]     Arrival date & time 12/14/14  0740 History   First MD Initiated Contact with Patient 12/14/14 [phone]     Chief Complaint  Patient presents with  . Otalgia     (Consider location/radiation/quality/duration/timing/severity/associated sxs/prior Treatment) HPI   Jesse Harding is a pleasant 49 y.o. Man, otherwise healthy, who [facility] for a 1 month history of a sore on his right ear that has been more swollen over the past two days.  He states he has a history of picking at his skin, and is covered in scars all over his arms from it, and he frequently picks at "ingrown hairs" on his ears.  This one has been there for a month, has not changed over that time, except for over the last three days his ear was more swollen and there has been clear drainage.  He has a lump directly below it which has also increased in size, although this morning when he woke up there was a larger amount of dried fluid in and on his ear and also on his pillow, and the swelling had decreased.  The lump is mildly tender.  He denies any redness associated with the swelling.  He has not had any fever, chills, or sweats.  He has no difficulty chewing, moving jaw or swallowing. He does not have insurance and is concerned about cost.  He has had several other episodes of lesions on his ears or neck that he has repeatedly picked at, with subsequent swollen "bumps" that eventually resolve on their own.  He is concerned because this one has been there the longest, and has caused the most swelling, although it is better today than it was last night.  Past Medical History  Diagnosis Date  . Arthritis    History reviewed. No pertinent past surgical history. History reviewed. No pertinent family history. History  Substance Use Topics  . Smoking status: Current Every Day Smoker    Types: Cigarettes  . Smokeless tobacco: Not on file  . Alcohol Use: No    Review of Systems  All other systems  reviewed and are negative.     Allergies  Review of patient's allergies indicates no known allergies.  Home Medications   Prior to Admission medications   Medication Sig Start Date End Date Taking? Authorizing Provider  HYDROcodone-acetaminophen (NORCO/VICODIN) 5-325 MG per tablet Take 2 tablets by mouth every 4 (four) hours as needed for pain. 12/14/12   Fayrene HelperBowie Tran, PA-C  ibuprofen (ADVIL,MOTRIN) 200 MG tablet Take 400 mg by mouth every 6 (six) hours as needed for pain.    Historical Provider, MD  meloxicam (MOBIC) 7.5 MG tablet Take 2 tablets (15 mg total) by mouth daily. 11/22/12   Roxy Horsemanobert Browning, PA-C  methocarbamol (ROBAXIN) 750 MG tablet Take 1 tablet (750 mg total) by mouth 4 (four) times daily as needed. 12/03/12   Trixie DredgeEmily West, PA-C  Multiple Vitamin (MULTIVITAMIN WITH MINERALS) TABS Take 1 tablet by mouth daily.    Historical Provider, MD  Naproxen Sodium (ALEVE PO) Take 440 mg by mouth 2 (two) times daily.    Historical Provider, MD  oxyCODONE-acetaminophen (PERCOCET) 5-325 MG per tablet Take 1 tablet by mouth every 4 (four) hours as needed for pain. 12/30/12   Eber HongBrian Miller, MD   BP 118/85 mmHg  Pulse 74  Temp(Src) 97.9 F (36.6 C) (Oral)  Resp 18  SpO2 99% Physical Exam  Constitutional: He is oriented to person, place, and  time. He appears well-developed and well-nourished. No distress.  HENT:  Head: Normocephalic and atraumatic.  Right Ear: Tympanic membrane and ear canal normal. There is drainage, swelling and tenderness. No mastoid tenderness. Tympanic membrane is not injected, not scarred, not perforated, not erythematous and not bulging. No middle ear effusion. No hemotympanum.  Left Ear: Tympanic membrane, external ear and ear canal normal.  Ears:  Nose: Nose normal.  Mouth/Throat: Uvula is midline, oropharynx is clear and moist and mucous membranes are normal. Mucous membranes are not pale, not dry and not cyanotic. No oral lesions. No trismus in the jaw. No uvula  swelling. No oropharyngeal exudate, posterior oropharyngeal edema or posterior oropharyngeal erythema.  Erythematous lesion on right pinna, with dried clear drainage, no blood, no purulent discharge, surrounding edema halfway up pinna and edema on right ear lobe. No surrounding erythema, induration or area of fluctuance. Discreet, firm, swollen lymph node directly inferior to ear, mildly tender to palpation.   Eyes: Conjunctivae and EOM are normal. Pupils are equal, round, and reactive to light. Right eye exhibits no discharge. Left eye exhibits no discharge. No scleral icterus.  Neck: Normal range of motion. Neck supple. No JVD present. No tracheal deviation present. No thyromegaly present.  Cardiovascular: Normal rate, regular rhythm, normal heart sounds and intact distal pulses.  Exam reveals no gallop and no friction rub.   No murmur heard. Pulmonary/Chest: Effort normal and breath sounds normal. No stridor. No respiratory distress. He has no wheezes. He has no rales. He exhibits no tenderness.  Abdominal: Soft. Bowel sounds are normal. He exhibits no distension and no mass. There is no tenderness. There is no rebound and no guarding.  Musculoskeletal: Normal range of motion. He exhibits no edema or tenderness.  Lymphadenopathy:    He has no cervical adenopathy.  Neurological: He is alert and oriented to person, place, and time. No cranial nerve deficit. He exhibits normal muscle tone. Coordination normal.  Skin: Skin is warm and dry. No rash noted. No erythema. No pallor.  Psychiatric: He has a normal mood and affect. His behavior is normal. Judgment and thought content normal.  Nursing note and vitals reviewed.   ED Course  Procedures (including critical care time) Labs Review Labs Reviewed - No data to display  Imaging Review No results found.   EKG Interpretation None      MDM   Final diagnoses:  None    Pt presents with scab on pinna of right ear, for a month, with  increased swelling Saturday and Sunday night with spontaneous drainage and decreased swelling today.   Plan - keflex, with follow-up outpatient Will give resource list, pt has not insurance, concerned about cost. Discussed importance of follow-up and gave clear return precautions.         Danelle Berry, PA-C 12/16/14 0009  Pricilla Loveless, MD 12/19/14 [phone]

## 2014-12-14 NOTE — ED Notes (Signed)
CBG 119 

## 2014-12-14 NOTE — Discharge Instructions (Signed)
Cellulitis Cellulitis is an infection of the skin and the tissue under the skin. The infected area is usually red and tender. This happens most often in the arms and lower legs. HOME CARE   Take your antibiotic medicine as told. Finish the medicine even if you start to feel better.  Keep the infected arm or leg raised (elevated).  Put a warm cloth on the area up to 4 times per day.  Only take medicines as told by your doctor.  Keep all doctor visits as told. GET HELP IF:  You see red streaks on the skin coming from the infected area.  Your red area gets bigger or turns a dark color.  Your bone or joint under the infected area is painful after the skin heals.  Your infection comes back in the same area or different area.  You have a puffy (swollen) bump in the infected area.  You have new symptoms.  You have a fever. GET HELP RIGHT AWAY IF:   You feel very sleepy.  You throw up (vomit) or have watery poop (diarrhea).  You feel sick and have muscle aches and pains. MAKE SURE YOU:   Understand these instructions.  Will watch your condition.  Will get help right away if you are not doing well or get worse. Document Released: 12/13/2007 Document Revised: 11/10/2013 Document Reviewed: 09/11/2011 [facility] Patient Information 2015 [place], [place]. This information is not intended to replace advice given to you by your health care provider. Make sure you discuss any questions you have with your health care provider.   Emergency Department Resource Guide 1) Find a Doctor and Pay Out of Pocket Although you won't have to find out who is covered by your insurance plan, it is a good idea to ask around and get recommendations. You will then need to call the office and see if the doctor you have chosen will accept you as a new patient and what types of options they offer for patients who are self-pay. Some doctors offer discounts or will set up payment plans for their patients who do  not have insurance, but you will need to ask so you aren't surprised when you get to your appointment.  2) Contact Your Local Health Department Not all health departments have doctors that can see patients for sick visits, but many do, so it is worth a call to see if yours does. If you don't know where your local health department is, you can check in your phone book. The CDC also has a tool to help you locate your state's health department, and many state websites also have listings of all of their local health departments.  3) Find a Walk-in Clinic If your illness is not likely to be very severe or complicated, you may want to try a walk in clinic. These are popping up all over the country in pharmacies, drugstores, and shopping centers. They're usually staffed by nurse practitioners or physician assistants that have been trained to treat common illnesses and complaints. They're usually fairly quick and inexpensive. However, if you have serious medical issues or chronic medical problems, these are probably not your best option.  No Primary Care Doctor: - Call Health Connect at  [phone] - they can help you locate a primary care doctor that  accepts your insurance, provides certain services, etc. - Physician Referral Service- [phone]  Chronic Pain Problems: Organization         Address  Phone   Notes  [facility]  [phone] Patients need to be referred by their primary care doctor.   Medication Assistance: Organization         Address  Phone   Notes  [facility] [address] [place] [place], [place] [address] [phone] --Must be a resident of [facility] -- Must have NO insurance coverage whatsoever (no Medicaid/ Medicare, etc.) -- The pt. MUST have a primary care doctor that directs their care regularly and follows them in the community   MedAssist  [phone]   [employer]  [phone]    Agencies that  provide inexpensive medical care: Organization         Address  Phone   Notes  [facility]  [phone]   [facility]    [phone]   [facility] [address] [place], [place] [address] [phone]   [facility] [address] [place], [place] [phone]   Planned Parenthood    [phone]   [facility]    [phone]   Community Health and [facility]  [address], [place] Phone:  [phone], Fax:  [phone] Hours of Operation:  9 am - 6 pm, M-F.  Also accepts Medicaid/Medicare and self-pay.  [facility]  [address], [place] Phone: [phone], Fax: [phone]. Hours of Operation:  8:30 am - 5:30 pm, M-F.  Also accepts Medicaid and self-pay.  [facility] [place] [address] [place] Phone: [phone]   [facility] [address] Natasha Bence [place], [place] [phone], Ext. [phone] Mondays & Thursdays: 7-9 AM.  First 15 patients are seen on a first come, first serve basis.    Medicaid-accepting [facility] Providers:  Organization         Address  Phone   Notes  [facility] [address], [place] [phone] Also accepts self-pay patients.  [facility] [address] Laurell Josephs [place], [place]  [phone]   [facility] [address], [place] [phone]   [facility] [address], [place] [phone]   Renaye Rakers [address], [place]   [phone] Only accepts [place] patients after they have their name applied to their card.   Self-Pay (no insurance) in [facility]:  Organization         Address  Phone   Notes  Sickle Cell Patients, [facility] [address] [place], [place] [phone]   [facility]  Urgent Care [address] [place], [place] [phone]   [facility] [place]  [address], [place] [phone]   [facility]/Dr. Osei-Bonsu  [address], [place] or [address], [place] [phone] Phone number for both [place] and [place] locations is the same.  Urgent Medical and [facility] [address] [place] [phone]   [facility] [address], [place] or [address] [phone]   [facility] [address] [place] [phone], phone; [phone], fax Sees patients 1st and 3rd Saturday of every month.  Must not qualify for public or private insurance (i.e. Medicaid, Medicare, [place]  Health Choice, Veterans' Benefits)  Household income should be no more than 200% of the poverty level The clinic cannot treat you if you are pregnant or think you are pregnant  Sexually transmitted diseases are not treated at the clinic.    Dental Care: Organization         Address  Phone  Notes  [facility] [address], [place] [phone] Accepts children up to age 50 who are enrolled in [place]icaid or [place] Health Choice; pregnant women with a Medicaid card; and children who have applied for Medicaid or [place] Health Choice, but were declined, whose parents can pay a reduced fee at time of service.  [facility] [place]  [address], [place] [phone] Accepts children up to age 50 who are enrolled in [place]icaid or [place] Health Choice; pregnant women with a Medicaid card; and children who have applied for Medicaid or [place] Health Choice, but were declined, whose parents can pay a reduced fee at time of service.  [facility]  [address], [place] [phone] Patients are seen by appointment only. Walk-ins  are not accepted. [facility] will see patients 50 years of age and older. Monday - Tuesday (8am-5pm) Most Wednesdays (8:30-5pm) $30 per visit, cash only  [facility]  [address], [facility] [place] [phone] Patients are seen by appointment only. Walk-ins are not accepted. [facility] will see patients 50 years of age and older. One Wednesday Evening (Monthly: Volunteer Based).  $30 per visit, cash only  [employer] [facility]  [phone] for adults; Children under age 424, call Graduate Pediatric Dentistry at [phone]. Children aged 84-14, please call [phone] to request a pediatric application.  Dental services are provided in all areas of dental care including fillings, crowns and bridges, complete and partial dentures, implants, gum treatment, root canals, and extractions. Preventive care is also provided. Treatment is provided to both adults and children. Patients are selected via a lottery and there is often a waiting list.   [facility] [address], [place]  [phone] [email or web address]   [facility] [address], [place], [place] [phone], Ext. [phone] Second and Fourth Thursday of each month, opens at 6:30 AM; Clinic ends at 9 AM.  Patients are seen on a first-come first-served basis, and a limited number are seen during each clinic.   [facility]  [address], [place], [place] [phone]   Eligibility Requirements You must have lived in [place], [place], or [place] for at least the last three months.   You cannot be eligible for state or federal sponsored National Cityhealthcare insurance, including CIGNAVeterans Administration, [place]icaid, or [employer]icare.   You generally cannot be eligible for healthcare insurance through your employer.    How to apply: Eligibility screenings are held every Tuesday and Wednesday afternoon from 1:00 pm until 4:00 pm. You do not need an appointment  for the interview!  [facility] [address], [place], [place] [phone]   [facility]  [phone]   [facility]  [phone]   [facility]  [phone]    Behavioral Health Resources in the Community: Intensive Outpatient Programs Organization         Address  Phone  Notes  [facility] [phone]  [address], [place], [place] [phone]   [facility] [address], [place], [place] [phone]   [facility] [address], [place], [place]  [phone]   [facility] [address],  [place], [place] [phone] or [phone]   Substance Abuse Resources Organization         Address  Phone  Notes  [facility]  [phone]   [facility]  [phone]   [facility]  [phone]   [facility]  [phone]   Residential & Outpatient Substance Abuse Program  [phone]   Psychological Services Organization         Address  Phone  Notes  [facility]  [phone]   [facility]  [phone]- [phone]   [facility] [address], [place] [phone] or [phone]    Mobile Crisis Teams Organization         Address  Phone  Notes  [facility], Mobile Crisis Care Unit  [phone]   [facility]  [address] [place], [place] [phone]   Doristine LocksSharon DeEsch [address] [place] [phone]    Self-Help/Support Groups Organization         Address  Phone             Notes  [facility] [place] - variety of support groups  [phone]- [phone] Call for more information  Narcotics Anonymous (NA), [facility] [address], [place]  2 meetings at this location   Statisticianesidential Treatment Programs Organization         Address  Phone  Notes  [facility]  [facility] [address],    [place]  [phone]   [facility]  [address], [place] [phone], [place], [place] [phone]   [facility] [address], [place] [phone] Admissions: 8am-3pm M-F  [facility] [address]    [place], [place] [phone]   [facility] [address], [place], [place] [phone]   [facility] [address]  [place], [place] [phone]   [facility] - Intensive Outpatient [address], [place], [place] [phone]   [facility] (Addiction Recovery [facility] [address]  [place], [place] [phone] or [phone]   [facility] ([facility]) [address] [place], [place] [phone] Accepts Medicaid  [facility] [address]  [place] [phone] Substance Abuse/Addiction Treatment   [facility] Resources Organization         Address  Phone  Notes  [facility]  [phone]) [phone]   Angie FavaJulie Brannon, PhD [address], [address] [place], [place]   [phone]) [phone] or [phone]   [facility]   [address] [place], [place] [phone]) [phone]   [facility] [address], [place], [place] [phone](336) [phone] Insurance/Medicaid/sponsorship through [facility]  [facility] Families [address]                                    [place], [place] [phone]) [phone] Therapy/tele-psych/case  [facility] [address].   [place], [place] [phone]) [phone]    Dr. Lolly MustacheArfeen  [phone]   [facility]  [employer] [facility] 1) [address],  [place] 2) [address] [place] 3)  [address], [place] [phone]  [phone]   [facility] Child Abuse Hotline 713 754 1847 or [phone] (After Hours)

## 2014-12-14 NOTE — ED Notes (Signed)
Pt c/o swollen, painful, edematous right external ear with serous drainage, onset after small nodule appeared on ear one month ago, which pt scratched until some skin came off. Edema tracks to supramandibular area below ear.

## 2015-01-09 IMAGING — CR DG HIP COMPLETE 2+V*R*
3 series · 3 of 3 positions shown · non-contrast
Comparison: None.

CLINICAL DATA: Injury 3 weeks previous with persistent pain

RIGHT HIP - COMPLETE 2+ VIEW

[t pelvis ap]
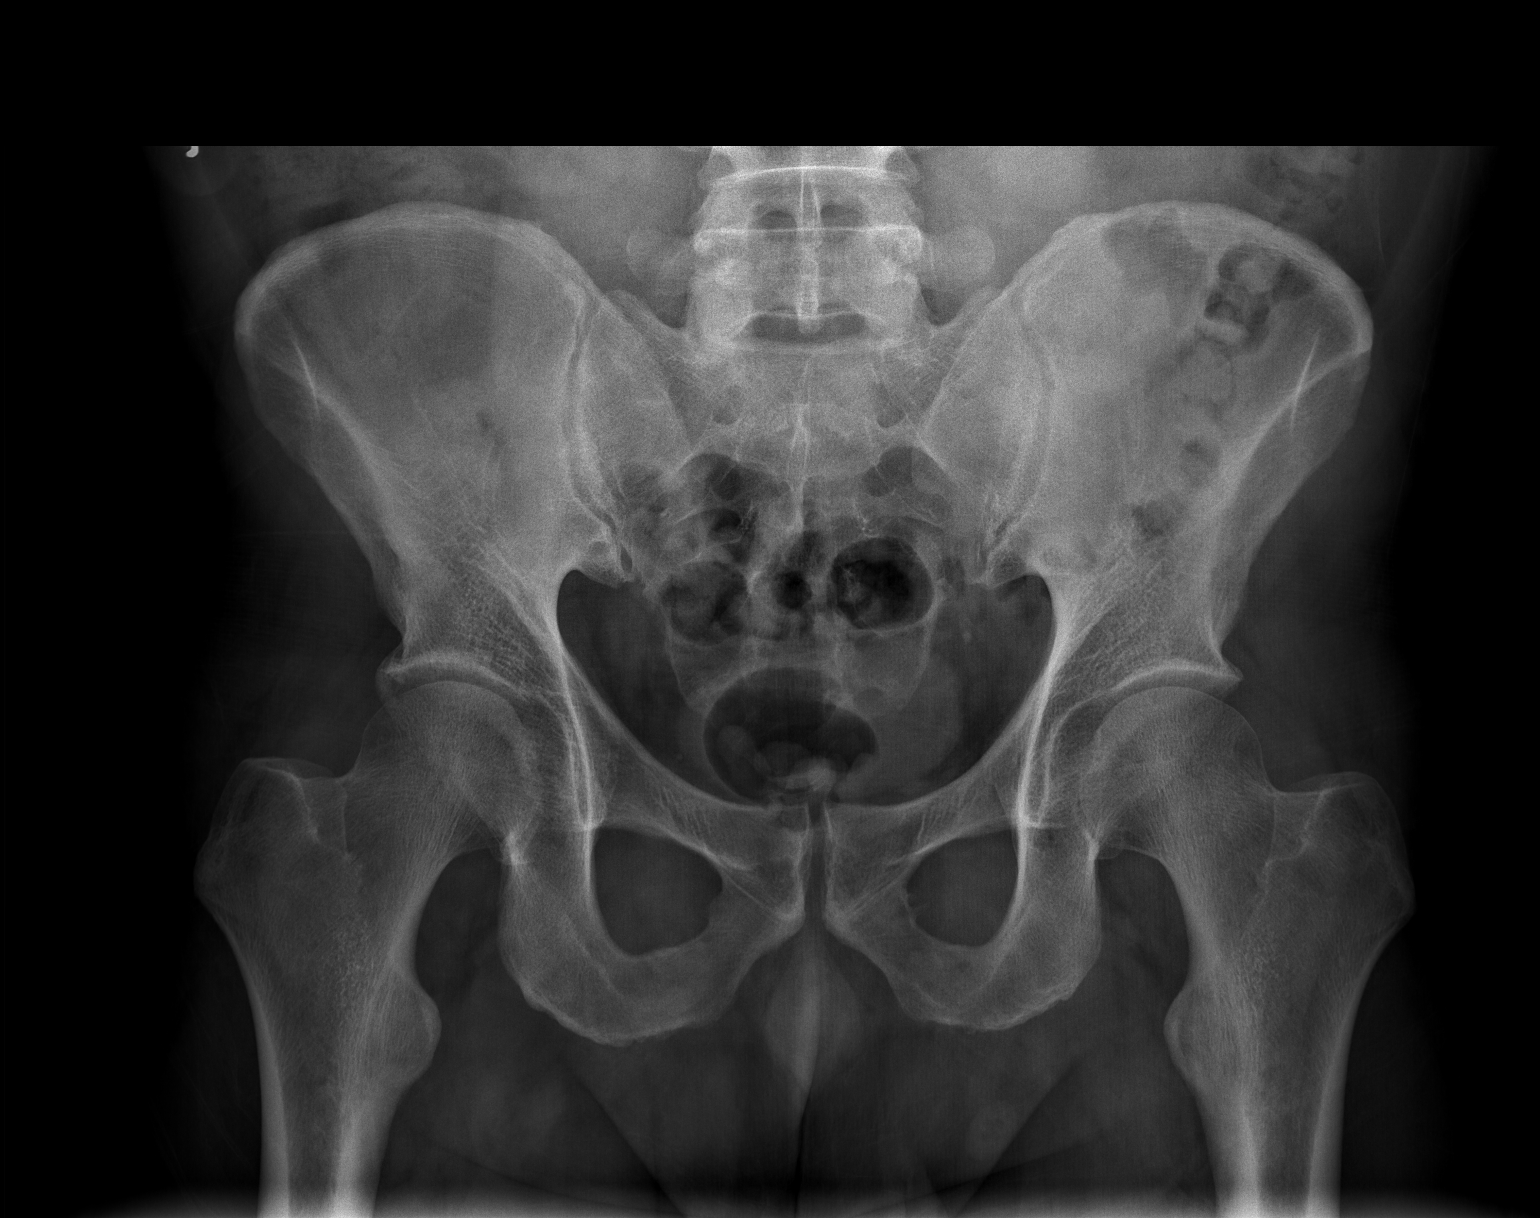

[t hip ap right]
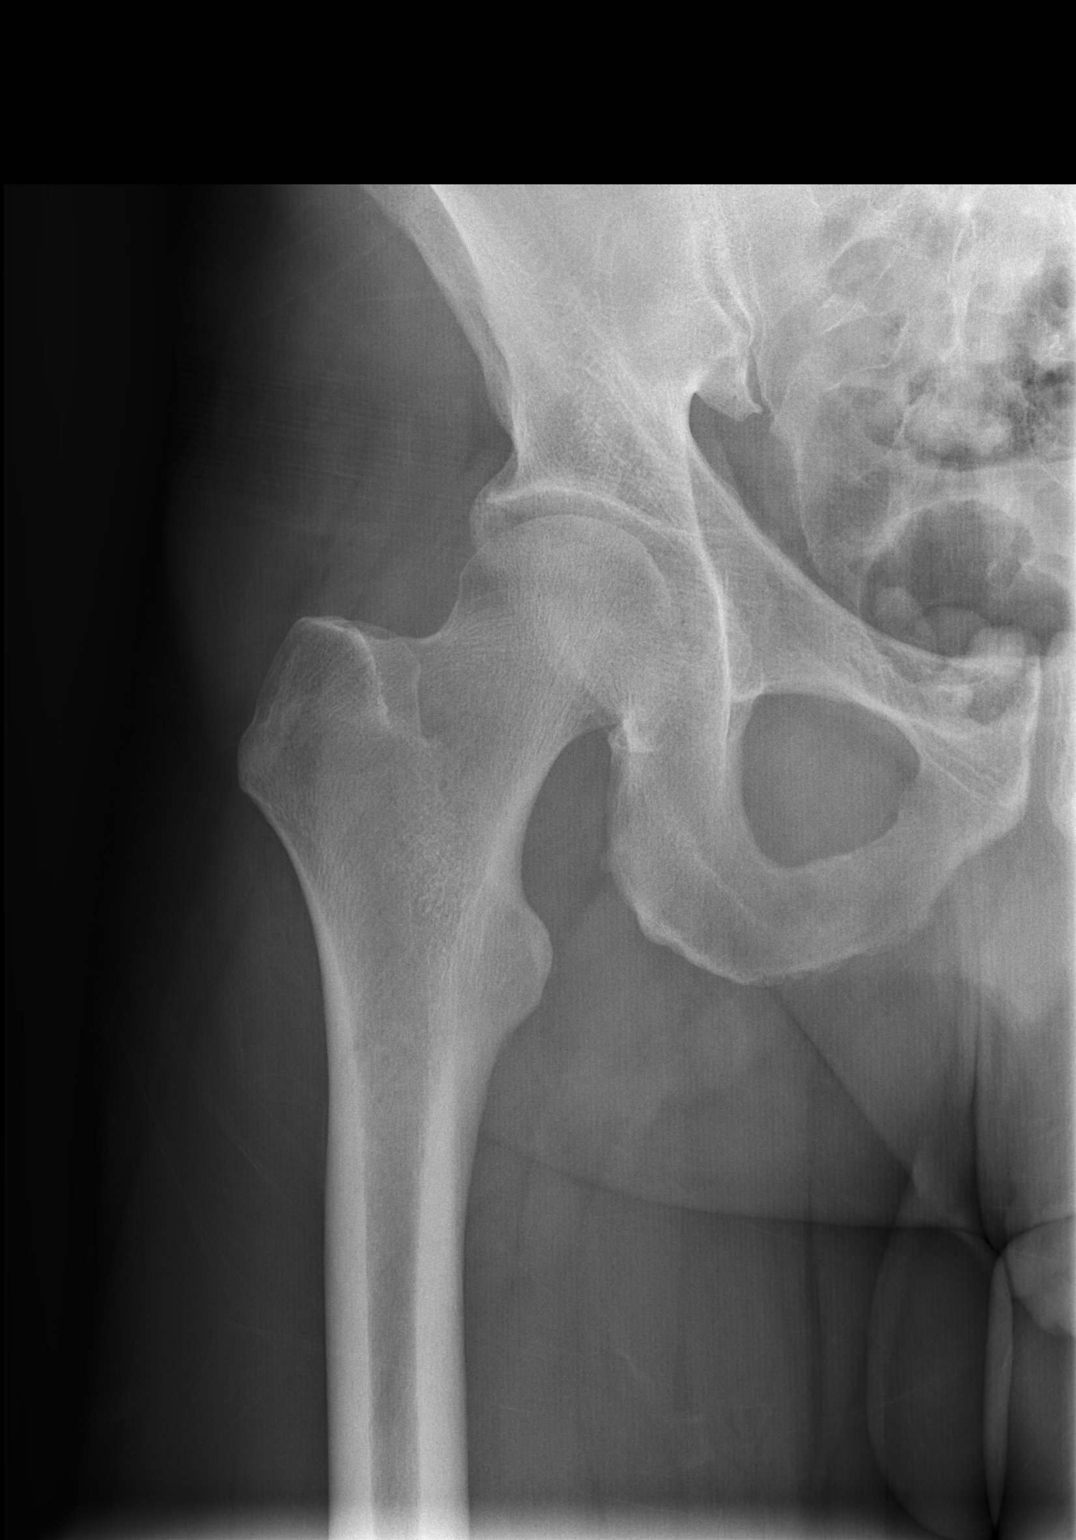

[t hip frog leg right]
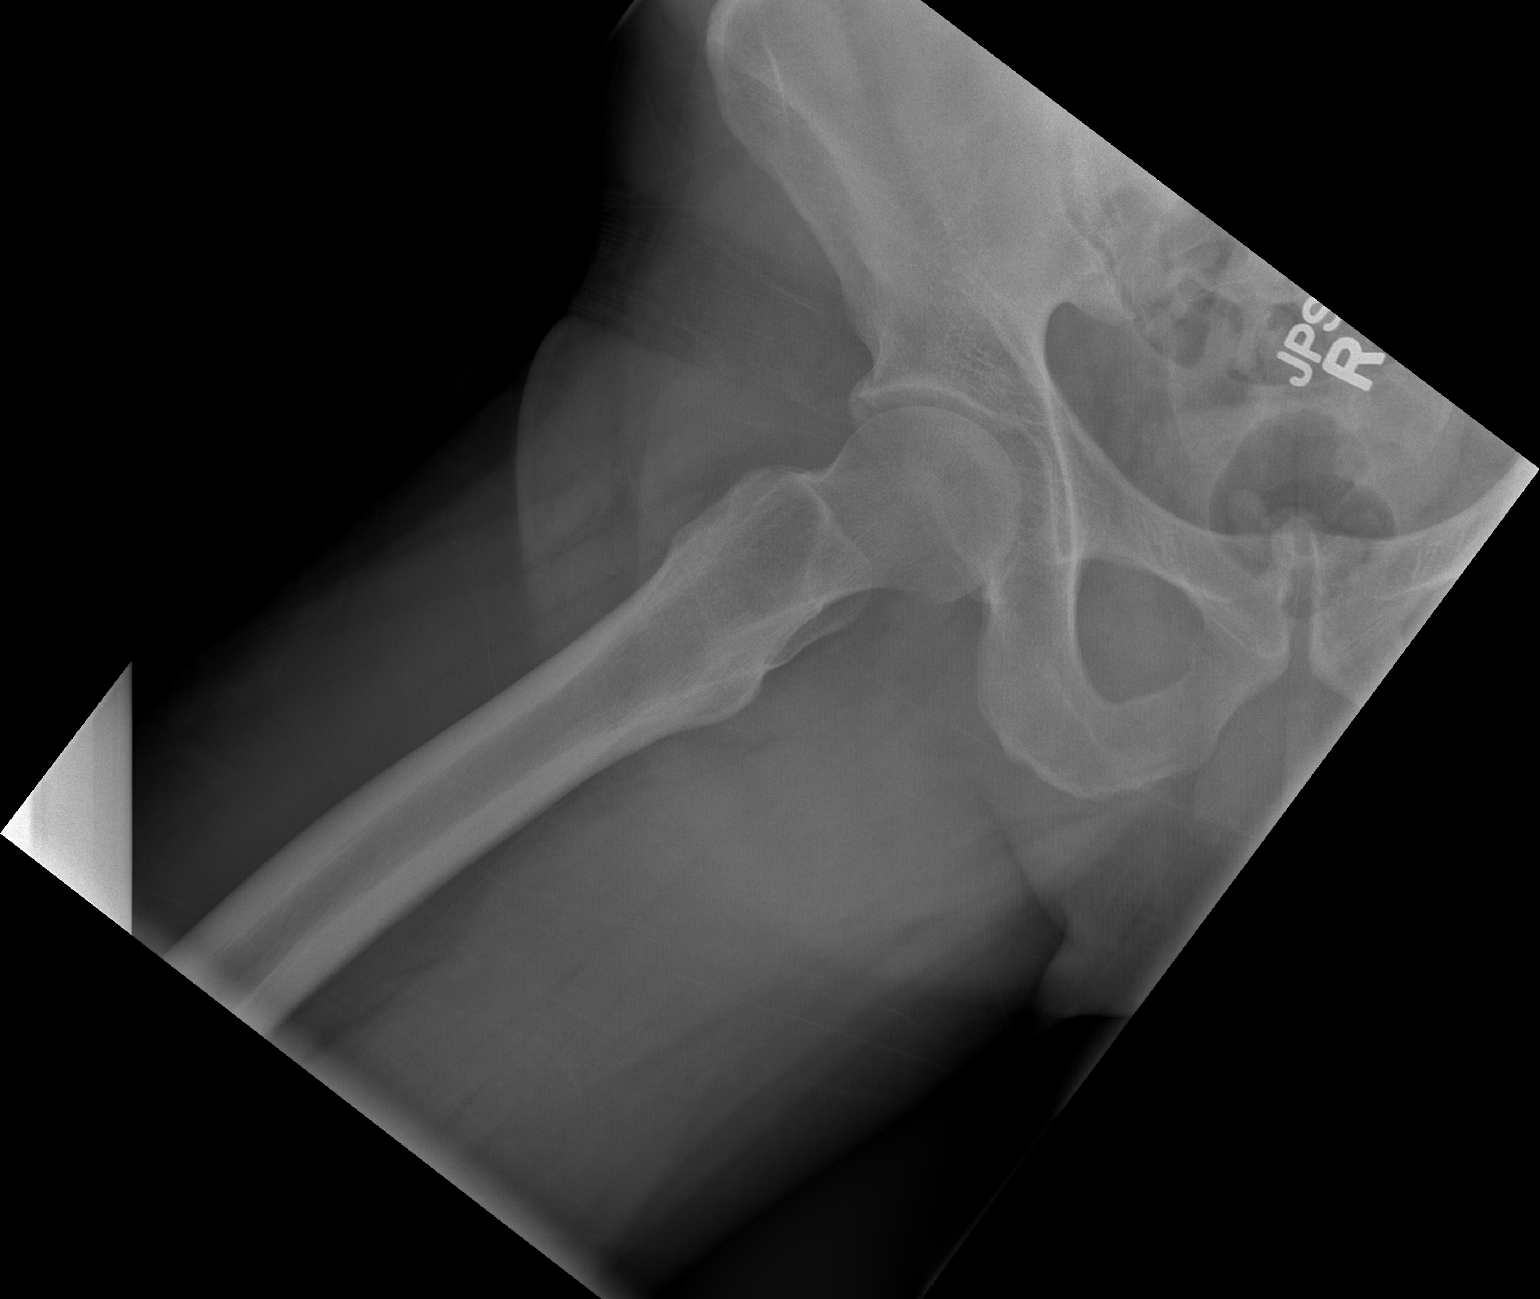

[3 of 3 positions shown; findings below may reference images not displayed]

FINDINGS: No acute fracture or dislocation is noted.  No soft
tissue abnormality is seen.  The pelvic ring is intact.
IMPRESSION: No acute abnormality noted.

## 2021-01-14 ENCOUNTER — Emergency Department (HOSPITAL_COMMUNITY): Payer: Self-pay

## 2021-01-14 ENCOUNTER — Encounter (HOSPITAL_COMMUNITY): Payer: Self-pay | Admitting: *Deleted

## 2021-01-14 ENCOUNTER — Emergency Department (HOSPITAL_BASED_OUTPATIENT_CLINIC_OR_DEPARTMENT_OTHER): Payer: Self-pay

## 2021-01-14 ENCOUNTER — Other Ambulatory Visit: Payer: Self-pay

## 2021-01-14 ENCOUNTER — Emergency Department (HOSPITAL_COMMUNITY)
Admission: EM | Admit: 2021-01-14 | Discharge: 2021-01-14 | Disposition: A | Discharge status: Home / Self Care | Payer: Self-pay | Attending: Emergency Medicine | Admitting: Emergency Medicine

## 2021-01-14 DIAGNOSIS — M25562 Pain in left knee: Secondary | ICD-10-CM | POA: Insufficient documentation

## 2021-01-14 DIAGNOSIS — M79662 Pain in left lower leg: Secondary | ICD-10-CM

## 2021-01-14 DIAGNOSIS — M7989 Other specified soft tissue disorders: Secondary | ICD-10-CM | POA: Insufficient documentation

## 2021-01-14 DIAGNOSIS — F17210 Nicotine dependence, cigarettes, uncomplicated: Secondary | ICD-10-CM | POA: Insufficient documentation

## 2021-01-14 MED ORDER — OXYCODONE-ACETAMINOPHEN 5-325 MG PO TABS: 1.0000 | ORAL_TABLET | Freq: Four times a day (QID) | ORAL | 0 refills | Status: DC | PRN

## 2021-01-14 NOTE — Discharge Instructions (Addendum)
Your xray and ultrasound did not show any acute abnormalities however you may have a ligamentous injury at this time causing pain.   Please wear knee immobilizer and use crutches to prevent baring weight onto the leg. Follow up with Dr. [facility] for further evaluation. Call to schedule an appointment for next week.   While at home please rest, ice, and elevate your leg to help reduce pain/swelling. Take the pain medication as needed.   Return to the ED for any new/worsening symptoms

## 2021-01-14 NOTE — ED Provider Notes (Signed)
[facility]-EMERGENCY DEPT Provider Note   CSN: [id number] Arrival date & time: 01/14/21  6295     History Chief Complaint  Patient presents with   Knee Pain    Jesse Harding is a 55 y.o. male who presents to the ED today with complaint of gradual onset, constant, achy/sharp, left knee pain for the past 2 weeks. Pt denies any known trauma. He does work in [employer] and frequently climbs ladders and crawls on his knees. He states the pain is worsened with walking on his leg. He also complains of swelling mostly to the knee however he has noticed his left lower leg appears more swollen as well. No hx DVT/PE. He has been taking OTC medications without relief. No other complaints at this time.   The history is provided by the patient and medical records.      Past Medical History:  Diagnosis Date   Arthritis     There are no problems to display for this patient.   History reviewed. No pertinent surgical history.     No family history on file.  Social History   Tobacco Use   Smoking status: Every Day    Pack years: 0.00    Types: Cigarettes  Substance Use Topics   Alcohol use: No   Drug use: Yes    Types: Marijuana    Home Medications Prior to Admission medications   Medication Sig Start Date End Date Taking? Authorizing Provider  oxyCODONE-acetaminophen (PERCOCET/ROXICET) 5-325 MG tablet Take 1 tablet by mouth every 6 (six) hours as needed for severe pain. 01/14/21  Yes Ethelyne Erich, PA-C  cephALEXin (KEFLEX) 500 MG capsule Take 1 capsule (500 mg total) by mouth 4 (four) times daily. 12/14/14   Danelle Berry, PA-C  HYDROcodone-acetaminophen (NORCO/VICODIN) 5-325 MG per tablet Take 2 tablets by mouth every 4 (four) hours as needed for pain. 12/14/12   Fayrene Helper, PA-C  ibuprofen (ADVIL,MOTRIN) 200 MG tablet Take 400 mg by mouth every 6 (six) hours as needed for pain.    [provider]  meloxicam (MOBIC) 7.5 MG tablet Take 2 tablets (15 mg  total) by mouth daily. 11/22/12   Roxy Horseman, PA-C  methocarbamol (ROBAXIN) 750 MG tablet Take 1 tablet (750 mg total) by mouth 4 (four) times daily as needed. 12/03/12   Trixie Dredge, PA-C  Multiple Vitamin (MULTIVITAMIN WITH MINERALS) TABS Take 1 tablet by mouth daily.    [provider]  Naproxen Sodium (ALEVE PO) Take 440 mg by mouth 2 (two) times daily.    [provider]  oxyCODONE-acetaminophen (PERCOCET) 5-325 MG per tablet Take 1 tablet by mouth every 4 (four) hours as needed for pain. 12/30/12   Eber Hong, MD    Allergies    Patient has no known allergies.  Review of Systems   Review of Systems  Constitutional:  Negative for chills and fever.  Respiratory:  Negative for shortness of breath.   Cardiovascular:  Positive for leg swelling. Negative for chest pain.  Musculoskeletal:  Positive for arthralgias.  All other systems reviewed and are negative.  Physical Exam Updated Vital Signs BP 138/87 (BP Location: Left Arm)   Pulse 66   Temp 98 F (36.7 C)   Resp 18   SpO2 95%   Physical Exam Vitals and nursing note reviewed.  Constitutional:      Appearance: He is not ill-appearing.  HENT:     Head: Normocephalic and atraumatic.  Eyes:     Conjunctiva/sclera: Conjunctivae normal.  Cardiovascular:     Rate and Rhythm: Normal rate and regular rhythm.     Pulses: Normal pulses.  Pulmonary:     Effort: Pulmonary effort is normal.     Breath sounds: Normal breath sounds. No wheezing, rhonchi or rales.  Musculoskeletal:     Comments: Mild swelling noted to left distal lower extremity as well as around knee joint. No overlying skin changes including erythema or increased warmth. + TTP to posteriomedial aspect of left knee with limited ROM s/2 pain. Able to flex and extend however does take time s/2 pain. Sensation intact throughout. 2+ PT pulse. Negative anterior and posterior drawer test.   Skin:    General: Skin is warm and dry.     Coloration: Skin  is not jaundiced.  Neurological:     Mental Status: He is alert.    ED Results / Procedures / Treatments   Labs (all labs ordered are listed, but only abnormal results are displayed) Labs Reviewed - No data to display  EKG None  Radiology DG Knee Complete 4 Views Left  Result Date: 01/14/2021 CLINICAL DATA:  Knee pain for 2 weeks EXAM: LEFT KNEE - COMPLETE 4+ VIEW COMPARISON:  None. FINDINGS: No evidence of fracture, dislocation, or joint effusion. No evidence of arthropathy or other focal bone abnormality. Soft tissues are unremarkable. IMPRESSION: Negative. Electronically Signed   By: Marnee Spring M.D.   On: 01/14/2021 09:38   VAS [place] LOWER EXTREMITY VENOUS (DVT) ([facility] and [facility] 7a-7p)  Result Date: 01/14/2021  Lower Venous DVT Study Patient Name:  [facility]  Date of Exam:   01/14/2021 Medical Rec #: [id number]      Accession #:    [id number] Date of Birth: 1965/10/07     Patient Gender: M Patient Age:   54Y Exam Location:  [facility] Procedure:      VAS [place] LOWER EXTREMITY VENOUS (DVT) Referring Phys: [id number] [name] --------------------------------------------------------------------------------  Indications: Left knee/calf pain & swelling.  Comparison Study: No previous exam Performing Technologist: Jody Hill RVT, RDMS  Examination Guidelines: A complete evaluation includes B-mode imaging, spectral Doppler, color Doppler, and power Doppler as needed of all accessible portions of each vessel. Bilateral testing is considered an integral part of a complete examination. Limited examinations for reoccurring indications may be performed as noted. The reflux portion of the exam is performed with the patient in reverse Trendelenburg.  +-----+---------------+---------+-----------+----------+--------------+ RIGHTCompressibilityPhasicitySpontaneityPropertiesThrombus Aging +-----+---------------+---------+-----------+----------+--------------+ CFV  Full           Yes      Yes                                  +-----+---------------+---------+-----------+----------+--------------+   +---------+---------------+---------+-----------+----------+--------------+ LEFT     CompressibilityPhasicitySpontaneityPropertiesThrombus Aging +---------+---------------+---------+-----------+----------+--------------+ CFV      Full           Yes      Yes                                 +---------+---------------+---------+-----------+----------+--------------+ SFJ      Full                                                        +---------+---------------+---------+-----------+----------+--------------+  FV Prox  Full           Yes      Yes                                 +---------+---------------+---------+-----------+----------+--------------+ FV Mid   Full           Yes      Yes                                 +---------+---------------+---------+-----------+----------+--------------+ FV DistalFull           Yes      Yes                                 +---------+---------------+---------+-----------+----------+--------------+ PFV      Full                                                        +---------+---------------+---------+-----------+----------+--------------+ POP      Full           Yes      Yes                                 +---------+---------------+---------+-----------+----------+--------------+ PTV      Full                                                        +---------+---------------+---------+-----------+----------+--------------+ PERO     Full                                                        +---------+---------------+---------+-----------+----------+--------------+    Summary: RIGHT: - No evidence of common femoral vein obstruction.  LEFT: - There is no evidence of deep vein thrombosis in the lower extremity. - There is no evidence of superficial venous thrombosis.  - No cystic structure found in the  popliteal fossa.  *See table(s) above for measurements and observations.    Preliminary     Procedures Procedures   Medications Ordered in ED Medications - No data to display  ED Course  I have reviewed the triage vital signs and the nursing notes.  Pertinent labs & imaging results that were available during my care of the patient were reviewed by me and considered in my medical decision making (see chart for details).    MDM Rules/Calculators/A&P                          55 year old male who presents to the ED today with complaint of atraumatic left knee pain as well as swelling to the knee and left lower extremity distally for the past 2 weeks.  Worsened with ambulation.  No relief with over-the-counter  medications.  On arrival to the ED today vitals are stable and patient appears to be in no acute distress.  On my exam is no overlying skin changes today including erythema or increased warmth.  He has tenderness palpation to the posterior medial aspect of his knee with limited range of motion secondary to pain.  It is noted that patient has swelling noted around the knee as well as the distal left lower extremity without any calf tenderness palpation.  An x-ray was obtained which does not show any acute bony abnormality.  Concern for possible DVT given left lower extremity swelling, will obtain DVT study at this time.  If negative we will plan for knee immobilizer as well as crutches.  Exam not consistent with gout or septic arthritis at this time.  Patient will need to follow-up with Ortho if negative DVT study.   DVT study negative. No cystic structure appreciated. Will discharge with knee immobilizer. Pt has crutches with him. Pt to follow up with ortho. RICE therapy discussed. Will discharge with short course of pain medication.   This note was prepared using Dragon voice recognition software and may include unintentional dictation errors due to the inherent limitations of voice  recognition software.  Final Clinical Impression(s) / ED Diagnoses Final diagnoses:  Acute pain of left knee    Rx / DC Orders ED Discharge Orders          Ordered    oxyCODONE-acetaminophen (PERCOCET/ROXICET) 5-325 MG tablet  Every 6 hours PRN        01/14/21 1326             Discharge Instructions      Your xray and ultrasound did not show any acute abnormalities however you may have a ligamentous injury at this time causing pain.   Please wear knee immobilizer and use crutches to prevent baring weight onto the leg. [facility] for further [facility]. Call to schedule an appointment for next week.   While at home please rest, ice, and elevate your leg to help reduce pain/swelling. Take the pain medication as needed.   Return to the ED for any new/worsening symptoms       Tanda RockersVenter, Carita Sollars, Cordelia Poche-C 01/14/21 1328    Alvira MondaySchlossman, Erin, MD 01/15/21 2111

## 2021-01-14 NOTE — Progress Notes (Signed)
LLE venous duplex has been completed.  Preliminary results given to Tanda Rockers, PA.  Results can be found under chart review under CV PROC. 01/14/2021 1:20 PM Jesstin Studstill RVT, RDMS

## 2021-01-14 NOTE — Progress Notes (Signed)
Orthopedic Tech Progress Note Patient Details:  [facility]se [facility] [birth year] [id number]  Ortho Devices Type of Ortho Device: Knee Immobilizer Ortho Device/Splint Interventions: Application   Post Interventions Patient Tolerated: Well Instructions Provided: Care of device  Saul Fordyce 01/14/2021, 12:36 PM

## 2021-01-14 NOTE — ED Triage Notes (Signed)
Pt complains of left knee pain, worse for the past 2 weeks. He had to start using crutches this week d/t pain. He works [employer], Engineer, agricultural, crawls on knees. Does not recall injury to knee. No history of knee surgery.

## 2022-05-24 ENCOUNTER — Emergency Department: Payer: Self-pay

## 2022-05-24 ENCOUNTER — Other Ambulatory Visit: Payer: Self-pay

## 2022-05-24 ENCOUNTER — Emergency Department
Admission: EM | Admit: 2022-05-24 | Discharge: 2022-05-24 | Disposition: A | Discharge status: Home / Self Care | Payer: Self-pay | Attending: Emergency Medicine | Admitting: Emergency Medicine

## 2022-05-24 DIAGNOSIS — S91331A Puncture wound without foreign body, right foot, initial encounter: Secondary | ICD-10-CM

## 2022-05-24 DIAGNOSIS — S91341A Puncture wound with foreign body, right foot, initial encounter: Secondary | ICD-10-CM | POA: Insufficient documentation

## 2022-05-24 DIAGNOSIS — Y990 Civilian activity done for income or pay: Secondary | ICD-10-CM | POA: Insufficient documentation

## 2022-05-24 DIAGNOSIS — S92311B Displaced fracture of first metatarsal bone, right foot, initial encounter for open fracture: Secondary | ICD-10-CM | POA: Insufficient documentation

## 2022-05-24 DIAGNOSIS — Z23 Encounter for immunization: Secondary | ICD-10-CM | POA: Insufficient documentation

## 2022-05-24 DIAGNOSIS — W450XXA Nail entering through skin, initial encounter: Secondary | ICD-10-CM | POA: Insufficient documentation

## 2022-05-24 DIAGNOSIS — S92301B Fracture of unspecified metatarsal bone(s), right foot, initial encounter for open fracture: Secondary | ICD-10-CM

## 2022-05-24 MED ORDER — LIDOCAINE HCL (PF) 1 % IJ SOLN
10.0000 mL | Freq: Once | INTRAMUSCULAR | Status: AC
  Administered 2022-05-24: 10 mL via INTRADERMAL
  Filled 2022-05-24: qty 10

## 2022-05-24 MED ORDER — TETANUS-DIPHTH-ACELL PERTUSSIS 5-2.5-18.5 LF-MCG/0.5 IM SUSY
0.5000 mL | PREFILLED_SYRINGE | Freq: Once | INTRAMUSCULAR | Status: AC
  Administered 2022-05-24: 0.5 mL via INTRAMUSCULAR
  Filled 2022-05-24: qty 0.5

## 2022-05-24 MED ORDER — MORPHINE SULFATE (PF) 4 MG/ML IV SOLN
4.0000 mg | Freq: Once | INTRAVENOUS | Status: AC
  Administered 2022-05-24: 4 mg via INTRAVENOUS
  Filled 2022-05-24: qty 1

## 2022-05-24 MED ORDER — HYDROCODONE-ACETAMINOPHEN 5-325 MG PO TABS
1.0000 | ORAL_TABLET | Freq: Once | ORAL | Status: AC
  Administered 2022-05-24: 1 via ORAL
  Filled 2022-05-24: qty 1

## 2022-05-24 MED ORDER — OXYCODONE-ACETAMINOPHEN 10-325 MG PO TABS: ORAL_TABLET | ORAL | 0 refills | Status: AC

## 2022-05-24 MED ORDER — CIPROFLOXACIN IN D5W 400 MG/200ML IV SOLN
400.0000 mg | Freq: Once | INTRAVENOUS | Status: AC
  Administered 2022-05-24: 400 mg via INTRAVENOUS
  Filled 2022-05-24: qty 200

## 2022-05-24 MED ORDER — BACITRACIN ZINC 500 UNIT/GM EX OINT
TOPICAL_OINTMENT | Freq: Once | CUTANEOUS | Status: AC
  Administered 2022-05-24: 1 via TOPICAL
  Filled 2022-05-24: qty 0.9

## 2022-05-24 MED ORDER — ONDANSETRON HCL 4 MG/2ML IJ SOLN
4.0000 mg | Freq: Once | INTRAMUSCULAR | Status: AC
  Administered 2022-05-24: 4 mg via INTRAVENOUS
  Filled 2022-05-24: qty 2

## 2022-05-24 MED ORDER — CIPROFLOXACIN HCL 500 MG PO TABS: 500.0000 mg | ORAL_TABLET | Freq: Two times a day (BID) | ORAL | 0 refills | Status: AC

## 2022-05-24 NOTE — Discharge Instructions (Signed)
Please call and schedule a follow up appointment with the podiatrist.  Take the antibiotic as prescribed and until finished.  Take the pain medication for severe pain.   Keep the wound clean and dry. Use mild soap and water to clean the foot daily and apply antibiotic ointment to the open wounds.   For any sign of concern and if you are unable to see podiatry or primary care right away, return to the ER.

## 2022-05-24 NOTE — ED Notes (Signed)
Shoe and sock removed, nail in L foot.

## 2022-05-24 NOTE — ED Triage Notes (Signed)
Pt comes with c/o nail in foot. Pt states this happened while at work the patient states nail is embedded in top of right foot.

## 2022-05-24 NOTE — ED Provider Notes (Signed)
[facility] Provider Note    Event Date/Time   First MD Initiated Contact with Patient 05/24/22 1318     (approximate)   History   nail in foot   HPI  Jesse Harding is a 56 y.o. male presents to the emergency department for treatment and evaluation after shooting a framing nail through his right foot while at work.  The nail went in through the top of his foot.  Unsure of his last tetanus.  Bleeding is well controlled.     Physical Exam   Triage Vital Signs: ED Triage Vitals  Enc Vitals Group     BP 05/24/22 1303 (!) 136/99     Pulse Rate 05/24/22 1303 84     Resp 05/24/22 1303 18     Temp 05/24/22 1303 98.6 F (37 C)     Temp Source 05/24/22 1639 Oral     SpO2 05/24/22 1303 96 %     Weight --      Height --      Head Circumference --      Peak Flow --      Pain Score 05/24/22 1258 9     Pain Loc --      Pain Edu? --      Excl. in GC? --     Most recent vital signs: Vitals:   05/24/22 1303 05/24/22 1639  BP: (!) 136/99 131/84  Pulse: 84 81  Resp: 18 17  Temp: 98.6 F (37 C) 98.4 F (36.9 C)  SpO2: 96% 98%     General: Awake, no distress.  CV:  Good peripheral perfusion.  Resp:  Normal effort.  Abd:  No distention.  Other:  Pedal pulses of the right foot is 2+.  Foot is neurovascularly intact.   ED Results / Procedures / Treatments   Labs (all labs ordered are listed, but only abnormal results are displayed) Labs Reviewed - No data to display   EKG  Not indicated   RADIOLOGY  Image of the right foot reviewed and interpreted by me.  Foreign body identified.  Radiology report consistent with the same.  Post foreign body removal image viewed and interpreted by me.  Small avulsion fracture at the metatarsal of the great toe.   PROCEDURES:  Critical Care performed: No  .Foreign Body Removal  Date/Time: 05/24/2022 7:42 PM  Performed by: Chinita Pester, FNP Authorized by: Chinita Pester, FNP  Consent:  Verbal consent obtained. Consent given by: patient Patient understanding: patient states understanding of the procedure being performed Imaging studies: imaging studies available Patient identity confirmed: verbally with patient Body area: skin General location: lower extremity Location details: right foot Anesthesia: local infiltration  Anesthesia: Local Anesthetic: lidocaine 1% without epinephrine Anesthetic total: 6 mL Localization method: visualized Removal mechanism: pulled with pliers. Depth: deep Complexity: complex 1 objects recovered. Objects recovered: framing nail Post-procedure assessment: foreign body removed Patient tolerance: patient tolerated the procedure well with no immediate complications   MEDICATIONS ORDERED IN ED: Medications  morphine (PF) 4 MG/ML injection 4 mg (4 mg Intravenous Given 05/24/22 1328)  ondansetron (ZOFRAN) injection 4 mg (4 mg Intravenous Given 05/24/22 1328)  ciprofloxacin (CIPRO) IVPB 400 mg (0 mg Intravenous Stopped 05/24/22 1457)  lidocaine (PF) (XYLOCAINE) 1 % injection 10 mL (10 mLs Intradermal Given 05/24/22 1416)  morphine (PF) 4 MG/ML injection 4 mg (4 mg Intravenous Given 05/24/22 1457)  Tdap (BOOSTRIX) injection 0.5 mL (0.5 mLs Intramuscular Given 05/24/22 1557)  bacitracin ointment (  1 Application Topical Given 05/24/22 1611)  HYDROcodone-acetaminophen (NORCO/VICODIN) 5-325 MG per tablet 1 tablet (1 tablet Oral Given 05/24/22 1611)     IMPRESSION / MDM / ASSESSMENT AND PLAN / ED COURSE  I reviewed the triage vital signs and the nursing notes.                              Differential diagnosis includes, but is not limited to, soft tissue injury, tendon injury, fracture, nerve injury, retained foreign body  Patient's presentation is most consistent with acute complicated illness / injury requiring diagnostic workup.  56 year old male presenting to the emergency department for treatment and evaluation after accidentally  shooting himself in the right foot with a pneumatic framing gun.  See HPI for further details.  Image of the right foot without evidence of nail penetrating the bone.  Plan will be to administer pain medication, then inject through the track of the retained nail and remove it.  Pliers were obtained and cleansed and an alcohol soak for 15 minutes.  Nail removed as described above.  Patient tolerated the procedure very well.  He will be treated with ciprofloxacin because the nail did penetrate through the rubber sole of his shoe then pulled back through despite the nail being cleaned with Betadine before hand.  After removal, x-ray repeated which shows a small avulsion fracture of the middle tarsal of the great toe.  He will be wrapped with an antibiotic and sterile dressing then postop shoe applied.  Patient is to call and schedule follow-up appointment with podiatry.  He was encouraged to rest and limit weightbearing on the foot as much as possible.  Antibiotics submitted to his pharmacy as well as pain medication for severe pain.  Tdap updated today.  He was advised to return to the emergency department for any sign or concern of infection if he is unable to see his primary care provider or the podiatrist right away.     FINAL CLINICAL IMPRESSION(S) / ED DIAGNOSES   Final diagnoses:  Open avulsion fracture of metatarsal bone of right foot, initial encounter  Puncture wound of right foot, initial encounter     Rx / DC Orders   ED Discharge Orders          Ordered    ciprofloxacin (CIPRO) 500 MG tablet  2 times daily        05/24/22 1540    oxyCODONE-acetaminophen (PERCOCET) 10-325 MG tablet        05/24/22 1540             Note:  This document was prepared using Dragon voice recognition software and may include unintentional dictation errors.   Chinita Pester, FNP 05/24/22 1947    Merwyn Katos, MD 05/25/22 2330

## 2023-02-10 ENCOUNTER — Emergency Department
Admission: EM | Admit: 2023-02-10 | Discharge: 2023-02-10 | Disposition: A | Discharge status: Home / Self Care | Payer: BLUE CROSS/BLUE SHIELD | Attending: Student in an Organized Health Care Education/Training Program | Admitting: Student in an Organized Health Care Education/Training Program

## 2023-02-10 ENCOUNTER — Emergency Department: Payer: BLUE CROSS/BLUE SHIELD

## 2023-02-10 ENCOUNTER — Other Ambulatory Visit: Payer: Self-pay

## 2023-02-10 DIAGNOSIS — M79671 Pain in right foot: Secondary | ICD-10-CM | POA: Diagnosis present

## 2023-02-10 NOTE — ED Provider Notes (Signed)
[facility] Provider Note    Event Date/Time   First MD Initiated Contact with Patient 02/10/23 [phone]     (approximate)   History   Foot Pain   HPI  Jesse Harding is a 57 y.o. male who presents for evaluation of right foot pain.  He reports that this has been ongoing for several months, after he accidentally put a nail in the splint.  He has not noticed any redness, swelling, fevers, or chills.  He is still able to ambulate.  He reports that he has had trouble with his insurance and finally has insurance, therefore he wanted to come and make sure that his wound was healing correctly.  There are no problems to display for this patient.         Physical Exam   Triage Vital Signs: ED Triage Vitals  Encounter Vitals Group     BP 02/10/23 0806 (!) 145/77     Systolic BP Percentile --      Diastolic BP Percentile --      Pulse Rate 02/10/23 0806 61     Resp 02/10/23 0806 18     Temp 02/10/23 0806 97.9 F (36.6 C)     Temp src --      SpO2 02/10/23 0806 98 %     Weight --      Height 02/10/23 0804 5\' 7"  (1.702 m)     Head Circumference --      Peak Flow --      Pain Score 02/10/23 0804 6     Pain Loc --      Pain Education --      Exclude from Growth Chart --     Most recent vital signs: Vitals:   02/10/23 0806 02/10/23 0932  BP: (!) 145/77   Pulse: 61   Resp: 18 15  Temp: 97.9 F (36.6 C)   SpO2: 98%     Physical Exam Vitals and nursing note reviewed.  Constitutional:      General: Awake and alert. No acute distress.    Appearance: Normal appearance. The patient is normal weight.  HENT:     Head: Normocephalic and atraumatic.     Mouth: Mucous membranes are moist.  Eyes:     General: PERRL. Normal EOMs        Right eye: No discharge.        Left eye: No discharge.     Conjunctiva/sclera: Conjunctivae normal.  Cardiovascular:     Rate and Rhythm: Normal rate and regular rhythm.     Pulses: Normal pulses.  Pulmonary:      Effort: Pulmonary effort is normal. No respiratory distress.     Breath sounds: Normal breath sounds.  Abdominal:     Abdomen is soft. There is no abdominal tenderness. No rebound or guarding. No distention. Musculoskeletal:        General: No swelling. Normal range of motion.     Cervical back: Normal range of motion and neck supple.  Normal-appearing right foot.  There is a small well-healed puncture site to the top of the foot.  There is no surrounding erythema.  There is no tenderness in this location.  There is no swelling or fluctuance.  No lower extremity edema or evidence of infection.  Legs are equal in circumference bilaterally.  Full normal range of motion of hip, knee, ankle.  Normal 2+ pedal pulses.  Sensation intact light touch throughout.  Compartments soft and compressible throughout. Skin:  General: Skin is warm and dry.     Capillary Refill: Capillary refill takes less than 2 seconds.     Findings: No rash.  Neurological:     Mental Status: The patient is awake and alert.      ED Results / Procedures / Treatments   Labs (all labs ordered are listed, but only abnormal results are displayed) Labs Reviewed - No data to display   EKG     RADIOLOGY I independently reviewed and interpreted imaging and agree with radiologists findings.     PROCEDURES:  Critical Care performed:   Procedures   MEDICATIONS ORDERED IN ED: Medications - No data to display   IMPRESSION / MDM / ASSESSMENT AND PLAN / ED COURSE  I reviewed the triage vital signs and the nursing notes.   Differential diagnosis includes, but is not limited to, incomplete healing, retained foreign body, continued avulsion fracture, nerve injury, scar tissue.  I reviewed the patient's chart.  Patient was seen on 05/24/2022 after he accidentally shot himself in the right foot with a pneumatic framing gun.  At that time he was found to have a small chip type fracture off the dorsal medial cortex of  the first metatarsal diaphysis.  Patient is awake and alert, hemodynamically stable and neurovascularly intact.  There is an old appearing puncture site to the top of his foot which is well-healed.  There is no surrounding erythema, swelling, fluctuance, or induration.  There is no evidence of infection whatsoever.  He has no tenderness in this location.  X-ray obtained in triage reveals a healed fracture.  He has no lower extremity pitting edema, legs are equal in circumference bilaterally.  He has no previous history of DVT, I do not suspect DVT today.  He full normal range of motion of his hip, knee, ankle.  He is ambulatory with a steady gait.  He was given the information for podiatry as he requested now that he has insurance.  We discussed return precautions.  Patient or stands and agrees with plan.  He was discharged in stable condition.   Patient's presentation is most consistent with exacerbation of chronic illness.      FINAL CLINICAL IMPRESSION(S) / ED DIAGNOSES   Final diagnoses:  Foot pain, right     Rx / DC Orders   ED Discharge Orders     None        Note:  This document was prepared using Dragon voice recognition software and may include unintentional dictation errors.   Keturah Shavers 02/10/23 1048    Willy Eddy, MD 02/10/23 [phone]

## 2023-02-10 NOTE — Discharge Instructions (Signed)
Your previous fracture appears to have healed.  You may apply ice to the area for 20 minutes on, 20 minutes off.  Please follow-up with podiatry.  Please return for any new, worsening, or change in symptoms or other concerns.  It was a pleasure caring for you today.

## 2023-02-10 NOTE — ED Triage Notes (Signed)
Pt to ED for right foot pain for 3 days with knot to foot. Reports shot foot with nail gun in November. Ambulatory to triage with limp.

## 2023-02-10 NOTE — ED Notes (Addendum)
Pt states that they shot their foot with a nail gun in November (we removed the nail) and "it hasn't felt right since then. Pt states there is some swelling on the right side with tenderness and said from their "research online it could be signs of a blood clot and wants to be sure that isn't what is going on, because they can be dangerous." Pain is minimal. Pt also states that there is a "bruise on their left shin that has been there for like 2 years" and wants to know what that could be.

## 2024-06-26 ENCOUNTER — Emergency Department
Admission: EM | Admit: 2024-06-26 | Discharge: 2024-06-26 | Disposition: A | Discharge status: Home / Self Care | Attending: Emergency Medicine | Admitting: Emergency Medicine

## 2024-06-26 ENCOUNTER — Other Ambulatory Visit: Payer: Self-pay

## 2024-06-26 DIAGNOSIS — L03115 Cellulitis of right lower limb: Secondary | ICD-10-CM | POA: Diagnosis present

## 2024-06-26 MED ORDER — SULFAMETHOXAZOLE-TRIMETHOPRIM 800-160 MG PO TABS: 2.0000 | ORAL_TABLET | Freq: Two times a day (BID) | ORAL | 0 refills | Status: DC

## 2024-06-26 MED ORDER — CEPHALEXIN 500 MG PO CAPS: 500.0000 mg | ORAL_CAPSULE | Freq: Four times a day (QID) | ORAL | 0 refills | Status: DC

## 2024-06-26 NOTE — ED Triage Notes (Signed)
 Pt comes with right foot pain for week. Pt states wound noted to top of foot with red ring around it. Pt unsure if he got bit by something. Pt is not diabetic. Pt states no drainage

## 2024-06-26 NOTE — ED Provider Notes (Signed)
 [facility] Provider Note    Event Date/Time   First MD Initiated Contact with Patient 06/26/24 1105     (approximate)   History   Foot Pain   HPI  Jesse Harding is a 58 y.o. male history of arthritis and about a year ago had a nail extracted from his right foot.  Reports up-to-date on tetanus    RIGHT foot, 1 week has been noticing area of redness and irritation.  Notes continued on is not improved.  Reports he tried squeezing it at 1 point but really has not seen any significant discharge.  It is tender to walk on but not severe.  No numbness or weakness.  No fevers or chills no body aches.  Reports as he seems irritated when he walks on it he is concerned that there is a bit of infection there.  Feels like maybe it started as a pimple or spider bite -no insect seen   Physical Exam   Triage Vital Signs: ED Triage Vitals  Encounter Vitals Group     BP 06/26/24 1027 (!) 140/98     Girls Systolic BP Percentile --      Girls Diastolic BP Percentile --      Boys Systolic BP Percentile --      Boys Diastolic BP Percentile --      Pulse Rate 06/26/24 1027 72     Resp 06/26/24 1027 18     Temp 06/26/24 1027 98 F (36.7 C)     Temp src --      SpO2 06/26/24 1027 100 %     Weight 06/26/24 1026 183 lb (83 kg)     Height 06/26/24 1026 5' 7 (1.702 m)     Head Circumference --      Peak Flow --      Pain Score 06/26/24 1026 5     Pain Loc --      Pain Education --      Exclude from Growth Chart --     Most recent vital signs: Vitals:   06/26/24 1105 06/26/24 1227  BP:  (!) 134/93  Pulse:  70  Resp:  18  Temp:    SpO2: 100% 100%    General: Awake, no distress.  Very pleasant well-oriented CV:  Good peripheral perfusion.  Strong palpable pulses in the right foot including dorsalis pedis and posterior tibial.  Please see media upload Resp:  Normal effort.  Normal Abd:  No distention.  Other:  Evaluation of the right leg is normal including  right foot and toes with exception over the medial dorsal surface of the midfoot there is an area approximately 2 cm that is slightly erythematous, there is very mild scaling, and centrally small area of scab.  There is no necrosis.  It is not significantly indurated there is no fluctuance.  It is slightly warm to touch and mildly tender.  There is no tracking up the leg or edema of the remaining portion of the foot there is no necrotic findings no purpura and the foot is warm and well-perfused   ED Results / Procedures / Treatments   Labs (all labs ordered are listed, but only abnormal results are displayed) Labs Reviewed - No data to display   EKG     RADIOLOGY  Discussed and recommended an x-ray to evaluate for deeper injury or deeper signs of infection like bone infection.  Patient declined this, shared medical decision making utilized   PROCEDURES:  Critical Care performed: No  Procedures   MEDICATIONS ORDERED IN ED: Medications - No data to display   IMPRESSION / MDM / ASSESSMENT AND PLAN / ED COURSE  I reviewed the triage vital signs and the nursing notes.                              Differential diagnosis includes, but is not limited to, cellulitis of the right foot possible abscess but this seems unlikely by the exam, and findings such as underlying fracture without trauma, or osteomyelitis are unlikely.  He has no evidence of acute neurologic deficits for vascular deficit.  He has no systemic symptoms.  Discussed with the patient further workup including obtaining an x-ray to evaluate for potentially deeper infection that could need further treatment surgery etc., patient however advised that due to his timing of his day he has to leave for [facility] shortly and would rather trial antibiotics and close follow-up.  Reports of it is not getting better or if it is getting worse to come back to ER right away.  Given the exam and his history I think that is reasonable I  think unlikely he has an underlying lytic or osteomyelitic type lesion though he does report he put a nail through the same area year ago.  Although that healed well he is up-to-date on tetanus.  He has no evidence of more systemic symptoms or obvious deep space infection, there is no crepitance etc.  Patient's presentation is most consistent with acute, uncomplicated illness.  Discussed with patient very careful return precautions, information to follow-up with podiatry and also placed referral for follow-up purposes, he is aware of careful return precautions discussed common side effects including risk of skin blistering, Stevens-Johnson's like symptoms with use of Bactrim .  Will treat for both MRSA and other skin flora utilizing combination Bactrim  and cephalexin .  Patient to follow-up closely.  Return precautions and treatment recommendations and follow-up discussed with the patient who is agreeable with the plan.         FINAL CLINICAL IMPRESSION(S) / ED DIAGNOSES   Final diagnoses:  Cellulitis of right foot     Rx / DC Orders   ED Discharge Orders          Ordered    cephALEXin  (KEFLEX ) 500 MG capsule  4 times daily        06/26/24 1210    sulfamethoxazole -trimethoprim  (BACTRIM  DS) 800-160 MG tablet  2 times daily        06/26/24 1210    Ambulatory referral to Podiatry       Comments: R foot infection, follow-up   06/26/24 1211             Note:  This document was prepared using Dragon voice recognition software and may include unintentional dictation errors.   Dicky Anes, MD 06/26/24 1435

## 2024-06-26 NOTE — Discharge Instructions (Signed)
 You have been seen today in the Emergency Department (ED) for cellulitis, a superficial skin infection of the right foot.  We did discuss and I recommended getting x-rays to make sure you do not have a deeper infection or an infection in your bones, but given your limitations today this was not performed.  I do strongly recommend that you follow-up closely with podiatry 1 way or the other  Please follow up with your doctor or in the ED in 24-48 hours for recheck of your infection if you are not improving.  Call your doctor sooner or return to the ED if you develop worsening signs of infection such as: increased redness, increased pain, pus, fever, or other symptoms that concern you.

## 2024-06-26 NOTE — ED Notes (Signed)
 Pt's shoe and sock removed on right foot. Pt has an inflamed red area on the top of the foot by the great toe. Pt noticed this approx 1 week ago and assumes that it is a spider bite but is unsure of same.

## 2024-06-30 ENCOUNTER — Encounter: Payer: Self-pay | Admitting: Podiatry

## 2024-06-30 ENCOUNTER — Other Ambulatory Visit: Payer: Self-pay | Admitting: Podiatry

## 2024-06-30 ENCOUNTER — Ambulatory Visit: Admitting: Podiatry

## 2024-06-30 ENCOUNTER — Ambulatory Visit

## 2024-06-30 VITALS — Ht 67.0 in | Wt 183.0 lb

## 2024-06-30 DIAGNOSIS — L02611 Cutaneous abscess of right foot: Secondary | ICD-10-CM | POA: Diagnosis not present

## 2024-06-30 DIAGNOSIS — S91301A Unspecified open wound, right foot, initial encounter: Secondary | ICD-10-CM

## 2024-06-30 MED ORDER — SULFAMETHOXAZOLE-TRIMETHOPRIM 800-160 MG PO TABS: 1.0000 | ORAL_TABLET | Freq: Two times a day (BID) | ORAL | 0 refills | Status: DC

## 2024-06-30 NOTE — Progress Notes (Signed)
" ° °  Chief Complaint  Patient presents with   Wound Check    Pt is here due to possible insect bite to the top of the right foot, he states that it started off as a hard knot, now the the area is red, swollen and soft, he was seen in the ER on the 12/18 for this issue was put on 2 types of antibiotics one that he finished today, states the area is tender to touch and can not put normal shoe on.    Subjective: Patient presents today as a referral from the ED for evaluation of abscess with infection to the right foot.  He believes this may be secondary to an insect bite.  Past Medical History:  Diagnosis Date   Arthritis     No past surgical history on file.  Allergies[1]   RT foot 06/30/2024  Objective:  General: Well developed, nourished, in no acute distress, alert and oriented x3   Dermatology: Large fluctuant abscess noted to the medial aspect of the right great toe overlying the MTP joint.  Associated tenderness with palpation as well as erythema localized around the area  Vascular: DP and PT pulses palpable.  No clinical evidence of vascular compromise  Neruologic: Grossly intact via light touch bilateral.  Musculoskeletal: No pedal deformity noted  Assesement/Plan of Care: #1  Abscess right foot #2 cellulitis right foot  -Patient evaluated.  X-rays reviewed -Due to the abscess to the area with surrounding cellulitis I do believe that the abscess needs to be drained today.  Patient was amenable to this plan.  3 mL of 2% lidocaine  plain was infiltrated proximal to the abscess and the area was prepped aseptically with Betadine. -A small incision was made directly overlying the abscess using a #15 scalpel and immediate purulent drainage was expressed from the area.  Heavy purulent drainage and phlegm and was expressed totaling approximately 3-5cc.  -Irrigation was utilized and dressings were applied.  Leave clean dry and intact -Cultures were taken and sent to pathology for  culture and sensitivities -Prescription for Bactrim  DS x 10 days -Note for work was provided. -Return to clinic 1 week  Thresa EMERSON Sar, DPM [facility]  Dr. Thresa EMERSON Sar, DPM    [address] [place], [place] [address]                Office [phone]  Fax [phone]        [1] No Known Allergies  "

## 2024-07-06 LAB — CULTURE,AEROBIC BACTERIA WITH GRAM STAIN
MICRO NUMBER:: 17396640
SPECIMEN QUALITY:: ADEQUATE

## 2024-07-06 LAB — HOUSE ACCOUNT TRACKING

## 2024-07-07 ENCOUNTER — Ambulatory Visit: Admitting: Podiatry

## 2024-07-07 DIAGNOSIS — L02611 Cutaneous abscess of right foot: Secondary | ICD-10-CM

## 2024-07-07 MED ORDER — LEVOFLOXACIN 500 MG PO TABS: 500.0000 mg | ORAL_TABLET | Freq: Every day | ORAL | 0 refills | Status: AC

## 2024-07-09 NOTE — Progress Notes (Signed)
 Subjective: Chief Complaint  Patient presents with   Wound Check    rm   58 year old male presents the office today for follow evaluation of abscess to his right foot.  He states that the foot is doing much better compared to what it was previously.  He has been on antibiotics but he states he developed a rash since starting the antibiotics mostly on his arms.  Does not report any swelling, shortness of breath or chest pain.  Currently denies any fevers or chills.   Objective: AAO x3, NAD DP/PT pulses palpable bilaterally, CRT less than 3 seconds On the dorsal aspect medial aspect of the right foot still area of erythematous area but there is no fluctuation or crepitation but there is no drainable collection noted today.  Is no ascending cellulitis.  Compared to the pictures that he showed me the foot is doing much better.  Mild tenderness palpation. No pain with calf compression, swelling, warmth, erythema     Assessment: Abscess right foot  Plan: -All treatment options discussed with the patient including all alternatives, risks, complications.  -Overall doing much better compared to what he was previously however still concerned about infection.  He did develop a rash since starting the antibiotics.  Recommended discontinue this and I prescribed Levaquin .  We discussed the blackbox warning of this medication and he is to limit activity and watch for any side effects of the medication. -Compression sock dispensed.  Elevation. -Monitor for any clinical signs or symptoms of infection and directed to call the office immediately should any occur or go to the ER. -Patient encouraged to call the office with any questions, concerns, change in symptoms.   Return in about 1 week (around 07/14/2024) for foot infection with Dr. Janit.  Donnice JONELLE Fees DPM

## 2024-07-21 ENCOUNTER — Ambulatory Visit: Admitting: Podiatry

## 2024-07-21 VITALS — Ht 67.0 in | Wt 183.0 lb

## 2024-07-21 DIAGNOSIS — L02611 Cutaneous abscess of right foot: Secondary | ICD-10-CM

## 2024-07-21 NOTE — Progress Notes (Signed)
" ° °  Chief Complaint  Patient presents with   Abscess    Rm 9 Patient is here to f/u on abscess of the right foot. Right foot has no visible signs of infection, pt states some continues pain in the right hallux when wearing tight socks or shoes.    Subjective: Patient presents today for follow-up evaluation after abscess to the right foot.  Initially seen 06/30/2024.  Patient is doing much better.  He no longer has any pain or tenderness to the area  Past Medical History:  Diagnosis Date   Arthritis     No past surgical history on file.  Allergies[1]   RT foot 06/30/2024  Objective:  General: Well developed, nourished, in no acute distress, alert and oriented x3   Dermatology: Abscess resolved.  There is no erythema or edema around the area.  No fluctuance concerning for residual underlying abscess formation.  The skin is warm and supple  Vascular: DP and PT pulses palpable.  No clinical evidence of vascular compromise  Neruologic: Grossly intact via light touch bilateral.  Musculoskeletal: No pedal deformity noted  Assesement/Plan of Care: #1  Abscess right foot; resolved #2 cellulitis right foot; resolved  -Patient evaluated.   -Okay to resume full activity no restrictions -Return to clinic PRN  Thresa EMERSON Sar, DPM [facility]  Dr. Thresa EMERSON Sar, DPM    [address] [place], [place] [address]                Office [phone]  Fax [phone]        [1] No Known Allergies  "
